# Patient Record
Sex: Female | Born: 1955 | Race: White | Hispanic: No | Marital: Married | Smoking: Never smoker
Health system: Southern US, Community
[De-identification: ages and names within clinical notes are randomized; demographics above are authoritative.]

## PROBLEM LIST (undated history)

## (undated) DIAGNOSIS — M5011 Cervical disc disorder with radiculopathy,  high cervical region: Secondary | ICD-10-CM

## (undated) DIAGNOSIS — E785 Hyperlipidemia, unspecified: Secondary | ICD-10-CM

## (undated) DIAGNOSIS — F32A Depression, unspecified: Secondary | ICD-10-CM

## (undated) DIAGNOSIS — F329 Major depressive disorder, single episode, unspecified: Secondary | ICD-10-CM

## (undated) DIAGNOSIS — M5441 Lumbago with sciatica, right side: Secondary | ICD-10-CM

## (undated) DIAGNOSIS — M5431 Sciatica, right side: Secondary | ICD-10-CM

## (undated) DIAGNOSIS — F419 Anxiety disorder, unspecified: Secondary | ICD-10-CM

## (undated) DIAGNOSIS — M1990 Unspecified osteoarthritis, unspecified site: Secondary | ICD-10-CM

## (undated) HISTORY — DX: Hyperlipidemia, unspecified: E78.5

## (undated) HISTORY — DX: Morbid (severe) obesity due to excess calories: E66.01

## (undated) HISTORY — DX: Sciatica, right side: M54.31

## (undated) HISTORY — DX: Lumbago with sciatica, right side: M54.41

## (undated) HISTORY — DX: Unspecified osteoarthritis, unspecified site: M19.90

## (undated) HISTORY — DX: Depression, unspecified: F32.A

## (undated) HISTORY — DX: Anxiety disorder, unspecified: F41.9

## (undated) HISTORY — DX: Cervical disc disorder with radiculopathy, high cervical region: M50.11

## (undated) HISTORY — PX: CHOLECYSTECTOMY: SHX55

---
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
## 2003-08-09 ENCOUNTER — Ambulatory Visit (HOSPITAL_COMMUNITY): Admission: RE | Admit: 2003-08-09 | Discharge: 2003-08-09 | Payer: Self-pay | Admitting: Obstetrics and Gynecology

## 2003-08-15 ENCOUNTER — Other Ambulatory Visit: Admission: RE | Admit: 2003-08-15 | Discharge: 2003-08-15 | Payer: Self-pay | Admitting: Obstetrics and Gynecology

## 2003-08-31 ENCOUNTER — Ambulatory Visit (HOSPITAL_COMMUNITY): Admission: RE | Admit: 2003-08-31 | Discharge: 2003-08-31 | Payer: Self-pay | Admitting: Obstetrics and Gynecology

## 2005-07-10 ENCOUNTER — Ambulatory Visit (HOSPITAL_COMMUNITY): Admission: RE | Admit: 2005-07-10 | Discharge: 2005-07-10 | Payer: Self-pay | Admitting: Obstetrics and Gynecology

## 2006-07-16 ENCOUNTER — Ambulatory Visit (HOSPITAL_COMMUNITY): Admission: RE | Admit: 2006-07-16 | Discharge: 2006-07-16 | Payer: Self-pay | Admitting: Obstetrics and Gynecology

## 2006-11-03 ENCOUNTER — Other Ambulatory Visit: Admission: RE | Admit: 2006-11-03 | Discharge: 2006-11-03 | Payer: Self-pay | Admitting: Obstetrics and Gynecology

## 2006-11-07 ENCOUNTER — Ambulatory Visit (HOSPITAL_COMMUNITY): Admission: RE | Admit: 2006-11-07 | Discharge: 2006-11-07 | Payer: Self-pay | Admitting: Obstetrics & Gynecology

## 2006-11-14 ENCOUNTER — Encounter (HOSPITAL_COMMUNITY): Admission: RE | Admit: 2006-11-14 | Discharge: 2006-12-14 | Payer: Self-pay | Admitting: Obstetrics & Gynecology

## 2006-12-08 ENCOUNTER — Ambulatory Visit (HOSPITAL_COMMUNITY): Admission: RE | Admit: 2006-12-08 | Discharge: 2006-12-08 | Payer: Self-pay | Admitting: General Surgery

## 2006-12-08 ENCOUNTER — Encounter (INDEPENDENT_AMBULATORY_CARE_PROVIDER_SITE_OTHER): Payer: Self-pay | Admitting: General Surgery

## 2007-03-05 ENCOUNTER — Ambulatory Visit (HOSPITAL_COMMUNITY): Admission: RE | Admit: 2007-03-05 | Discharge: 2007-03-05 | Payer: Self-pay | Admitting: Obstetrics & Gynecology

## 2007-08-19 ENCOUNTER — Ambulatory Visit (HOSPITAL_COMMUNITY): Admission: RE | Admit: 2007-08-19 | Discharge: 2007-08-19 | Payer: Self-pay | Admitting: Obstetrics and Gynecology

## 2008-06-23 ENCOUNTER — Ambulatory Visit (HOSPITAL_COMMUNITY): Admission: RE | Admit: 2008-06-23 | Discharge: 2008-06-23 | Payer: Self-pay | Admitting: Obstetrics & Gynecology

## 2008-08-17 ENCOUNTER — Other Ambulatory Visit: Admission: RE | Admit: 2008-08-17 | Discharge: 2008-08-17 | Payer: Self-pay | Admitting: Obstetrics and Gynecology

## 2008-08-19 ENCOUNTER — Ambulatory Visit (HOSPITAL_COMMUNITY): Admission: RE | Admit: 2008-08-19 | Discharge: 2008-08-19 | Payer: Self-pay | Admitting: Obstetrics & Gynecology

## 2008-12-05 ENCOUNTER — Ambulatory Visit (HOSPITAL_COMMUNITY): Admission: RE | Admit: 2008-12-05 | Discharge: 2008-12-05 | Payer: Self-pay | Admitting: Obstetrics and Gynecology

## 2008-12-14 ENCOUNTER — Encounter: Admission: RE | Admit: 2008-12-14 | Discharge: 2008-12-14 | Payer: Self-pay | Admitting: Obstetrics and Gynecology

## 2009-06-14 ENCOUNTER — Encounter: Admission: RE | Admit: 2009-06-14 | Discharge: 2009-06-14 | Payer: Self-pay | Admitting: Obstetrics and Gynecology

## 2010-02-25 ENCOUNTER — Encounter: Payer: Self-pay | Admitting: Obstetrics and Gynecology

## 2010-06-19 NOTE — Op Note (Signed)
NAMEMarland Kitchen  Sara Mann, Sara Mann            ACCOUNT NO.:  [id number]   MEDICAL RECORD NO.:  [id number]          PATIENT TYPE:  AMB   LOCATION:  DAY                           FACILITY:  [facility]   PHYSICIAN:  Jamesetta So, M.D.  DATE OF BIRTH:  1955/12/24   DATE OF PROCEDURE:  12/08/2006  DATE OF DISCHARGE:                               OPERATIVE REPORT   PREOPERATIVE DIAGNOSIS:  Chronic cholecystitis.   POSTOPERATIVE DIAGNOSIS:  Chronic cholecystitis.   PROCEDURE:  Laparoscopic cholecystectomy.   SURGEON:  Aviva Signs, MD   ANESTHESIA:  General endotracheal.   INDICATIONS:  The patient is a 55 year old white female who presents  with biliary colic secondary to chronic cholecystitis.  The risks and  benefits of the procedure including bleeding, infection, hepatobiliary  injury, and the possibly of an open procedure were fully explained to  the patient, who gave informed consent.   PROCEDURE NOTE:  The patient was placed in the supine position.  After  the induction of general endotracheal anesthesia, the abdomen was  prepped and draped using the usual sterile technique with Betadine.  Surgical site confirmation was performed.   A supraumbilical incision was made down to fascia.  A Veress needle was  introduced into the abdominal cavity and confirmation of placement was  done using the saline drop test.  The abdomen was then insufflated to 16-  mmHg pressure.  An 11-mm trocar was introduced into the abdominal cavity  under direct visualization without difficulty.  The patient was placed  in reversed Trendelenburg position.  An additional 11-mm trocar was  placed in the epigastric region and 5-mm trocars were placed in the  right upper quadrant and right flank regions.  The liver was inspected  and noted to within normal limits.  The gallbladder was retracted  superior and laterally.  The dissection was begun around the  infundibulum of the gallbladder.  The cystic duct was first  identified.  It juncture to the infundibulum was fully identified.  Endoclips were  placed proximally and distally on the cystic duct and cystic duct was  divided; this likewise done to the cystic artery.  The gallbladder was  then freed away from the gallbladder fossa using Bovie electrocautery.  The gallbladder was delivered through the epigastric trocar site using  an EndoCatch bag.  The gallbladder fossa was inspected and no abnormal  bleeding or bile leakage was noted.  Surgicel was placed in the  gallbladder fossa.  All fluid and air were then evacuated from the  abdominal cavity prior to the removal of the trocars.   All wounds were irrigated with normal saline.  All wounds were injected  with 0.5% Sensorcaine.  The supraumbilical fascia was reapproximated  using an 0 Vicryl interrupted suture.  All skin incisions were closed  using staples.  Betadine ointment and dry sterile dressings were  applied.   All tape and needle counts were correct at the end of the procedure.  The patient was extubated in the operating room and went back to the  recovery room, awake and in stable condition.   COMPLICATIONS:  None.  SPECIMEN:  Gallbladder.   BLOOD LOSS:  Minimal.      Jamesetta So, M.D.  Electronically Signed     MAJ/MEDQ  D:  12/08/2006  T:  12/09/2006  Job:  [id number]   cc:   Florian Buff, M.D.  Fax: [phone]   Monico Blitz, MD  Fax: [phone]

## 2010-06-19 NOTE — H&P (Signed)
NAMEMarland Kitchen  Sara Mann, Sara Mann            ACCOUNT NO.:  [id number]   MEDICAL RECORD NO.:  [id number]          PATIENT TYPE:  AMB   LOCATION:  DAY                           FACILITY:  [facility]   PHYSICIAN:  Jamesetta So, M.D.  DATE OF BIRTH:  06-15-1955   DATE OF ADMISSION:  12/01/2006  DATE OF DISCHARGE:  LH                              HISTORY & PHYSICAL   CHIEF COMPLAINT:  Chronic cholecystitis.   HISTORY OF PRESENT ILLNESS:  The patient is a 55 year old white female  who is referred for evaluation treatment of biliary colic secondary to  chronic cholecystitis.  She has been having intermittent right upper  quadrant abdominal pain with radiation to the right flank, nausea, and  bloating for many months.  No fever, chills, or jaundice have been  noted.  She does have fatty food intolerance.   PAST MEDICAL HISTORY:  1. Anxiety.  2. Extrinsic allergies.   PAST SURGICAL HISTORY:  Unremarkable.   CURRENT MEDICATIONS:  Aleve.   ALLERGIES:  CODEINE, SOME ANTIHISTAMINES.   REVIEW OF SYSTEMS:  The patient denies drinking or smoking.  She denies  any other cardiopulmonary difficulties or bleeding disorders.   PHYSICAL EXAMINATION:  The patient is a well-developed, well-nourished  white female in no acute distress.  HEENT: No scleral icterus.  LUNGS:  Clear to auscultation with equal breath sounds bilaterally.  HEART:  A regular rate and rhythm without S3, S4 or murmurs.  ABDOMEN:  Soft and nondistended.  She is tender in the right upper  quadrant to palpation.  No hepatosplenomegaly, masses, or hernias are  identified.   HIDA scan reveals chronic cholecystitis with a low gallbladder ejection  fraction and reproducible symptoms.   IMPRESSION:  Chronic cholecystitis.   PLAN:  The patient is scheduled for a laparoscopic cholecystectomy on  December 01, 2006.  The risks and benefits of the procedure including  bleeding, infection, hepatobiliary injury, and the possibly an open  procedure,  were fully explained to the patient, who gave informed  consent.      Jamesetta So, M.D.  Electronically Signed     MAJ/MEDQ  D:  11/27/2006  T:  11/28/2006  Job:  [id number]   cc:   [facility]  Fax: [phone]   Florian Buff, M.D.  Fax: [phone]   Monico Blitz, MD  Fax: [phone]

## 2010-11-14 LAB — BASIC METABOLIC PANEL
BUN: 9
CO2: 26
Calcium: 9.5
Chloride: 108
Creatinine, Ser: 0.57
GFR calc Af Amer: >60
GFR calc non Af Amer: >60
Glucose, Bld: 85
Potassium: 4.3
Sodium: 140

## 2010-11-14 LAB — HEPATIC FUNCTION PANEL
ALT: 21
AST: 21
Alkaline Phosphatase: 78
Bilirubin, Direct: 0.1
Indirect Bilirubin: 0.7
Total Bilirubin: 0.8

## 2010-11-14 LAB — CBC
MCHC: 33.9
MCV: 85.1
RDW: 14.2 — ABNORMAL HIGH

## 2019-01-05 ENCOUNTER — Ambulatory Visit: Payer: Medicare Other

## 2019-01-05 ENCOUNTER — Other Ambulatory Visit: Payer: Self-pay

## 2019-01-05 ENCOUNTER — Encounter: Payer: Self-pay | Admitting: Orthopaedic Surgery

## 2019-01-05 ENCOUNTER — Ambulatory Visit: Payer: Medicare Other | Admitting: Orthopaedic Surgery

## 2019-01-05 VITALS — BP 175/85 | HR 74 | Temp 96.4°F | Ht 61.5 in | Wt 230.2 lb

## 2019-01-05 DIAGNOSIS — G8929 Other chronic pain: Secondary | ICD-10-CM

## 2019-01-05 DIAGNOSIS — M25511 Pain in right shoulder: Secondary | ICD-10-CM

## 2019-01-05 DIAGNOSIS — Z6841 Body Mass Index (BMI) 40.0 and over, adult: Secondary | ICD-10-CM

## 2019-01-05 NOTE — Progress Notes (Signed)
Subjective:    Patient ID: Sara Mann, female    DOB: [birth year], 63 y.o.   MRN: [id number]  HPI She has had pain of the right shoulder for several months getting slowly worse.  She has no trauma, no redness,no swelling.  She has had some radiation to the elbow on the right. She has no neck pain.  It hurts more with elevation of hand above head or rolling on it at night in bed.  She has no other joint pain.  She was seen recently by family doctor and put on Mobic which has helped.  She has used ice and heat as well.  I have reviewed the notes from her primary care.   Review of Systems  Constitutional: Positive for activity change.  Musculoskeletal: Positive for arthralgias and myalgias.  All other systems reviewed and are negative.  For Review of Systems, all other systems reviewed and are negative.  The following is a summary of the past history medically, past history surgically, known current medicines, social history and family history.  This information is gathered electronically by the computer from prior information and documentation.  I review this each visit and have found including this information at this point in the chart is beneficial and informative.   No past medical history on file.    Current Outpatient Medications on File Prior to Visit  Medication Sig Dispense Refill  . meloxicam (MOBIC) 15 MG tablet Take 15 mg by mouth daily.     No current facility-administered medications on file prior to visit.     Social History   Socioeconomic History  . Marital status: Married    Spouse name: Not on file  . Number of children: Not on file  . Years of education: Not on file  . Highest education level: Not on file  Occupational History  . Not on file  Social Needs  . Financial resource strain: Not on file  . Food insecurity    Worry: Not on file    Inability: Not on file  . Transportation needs    Medical: Not on file    Non-medical: Not on file   Tobacco Use  . Smoking status: Never Smoker  . Smokeless tobacco: Never Used  Substance and Sexual Activity  . Alcohol use: Not on file  . Drug use: Not on file  . Sexual activity: Not on file  Lifestyle  . Physical activity    Days per week: Not on file    Minutes per session: Not on file  . Stress: Not on file  Relationships  . Social Musician on phone: Not on file    Gets together: Not on file    Attends religious service: Not on file    Active member of club or organization: Not on file    Attends meetings of clubs or organizations: Not on file    Relationship status: Not on file  . Intimate partner violence    Fear of current or ex partner: Not on file    Emotionally abused: Not on file    Physically abused: Not on file    Forced sexual activity: Not on file  Other Topics Concern  . Not on file  Social History Narrative  . Not on file   Family history of heart disease.  BP (!) 175/85   Pulse 74   Temp (!) 96.4 F (35.8 C)   Ht 5' 1.5" (1.562 m)   Wt 230  lb 4 oz (104.4 kg)   BMI 42.80 kg/m   Body mass index is 42.8 kg/m.  The patient meets the AMA guidelines for Morbid (severe) obesity with a BMI > 40.0 and I have recommended weight loss.      Objective:   Physical Exam Vitals signs reviewed.  Constitutional:      Appearance: She is well-developed.  HENT:     Head: Normocephalic and atraumatic.  Eyes:     Conjunctiva/sclera: Conjunctivae normal.     Pupils: Pupils are equal, round, and reactive to light.  Neck:     Musculoskeletal: Normal range of motion and neck supple.  Cardiovascular:     Rate and Rhythm: Normal rate and regular rhythm.  Pulmonary:     Effort: Pulmonary effort is normal.  Abdominal:     Palpations: Abdomen is soft.  Musculoskeletal:     Right shoulder: She exhibits decreased range of motion and tenderness.       Arms:  Skin:    General: Skin is warm and dry.  Neurological:     Mental Status: She is alert and  oriented to person, place, and time.     Cranial Nerves: No cranial nerve deficit.     Motor: No abnormal muscle tone.     Coordination: Coordination normal.     Deep Tendon Reflexes: Reflexes are normal and symmetric. Reflexes normal.  Psychiatric:        Behavior: Behavior normal.        Thought Content: Thought content normal.        Judgment: Judgment normal.   X-rays were ordered of the left shoulder, reported separately.        Assessment & Plan:   Encounter Diagnoses  Name Primary?  . Chronic right shoulder pain Yes  . Body mass index 40.0-44.9, adult ([place])   . Morbid obesity ([place])    PROCEDURE NOTE:  The patient request injection, verbal consent was obtained.  The right shoulder was prepped appropriately after time out was performed.   Sterile technique was observed and injection of 1 cc of Depo-Medrol 40 mg with several cc's of plain xylocaine. Anesthesia was provided by ethyl chloride and a 20-gauge needle was used to inject the shoulder area. A posterior approach was used.  The injection was tolerated well.  A band aid dressing was applied.  The patient was advised to apply ice later today and tomorrow to the injection sight as needed.  Continue the Mobic.  Use Aspercreme or BioFreeze to the area as desired.  Return in three weeks.  Call if any problem.  Precautions discussed.   Electronically Signed Sanjuana Kava, MD 12/1/20202:49 PM

## 2019-01-13 ENCOUNTER — Other Ambulatory Visit: Payer: Self-pay | Admitting: Internal Medicine

## 2019-01-13 DIAGNOSIS — E2839 Other primary ovarian failure: Secondary | ICD-10-CM

## 2019-01-26 ENCOUNTER — Ambulatory Visit: Payer: Medicare Other | Admitting: Orthopaedic Surgery

## 2019-01-26 ENCOUNTER — Encounter: Payer: Self-pay | Admitting: Orthopaedic Surgery

## 2019-01-26 ENCOUNTER — Other Ambulatory Visit: Payer: Self-pay

## 2019-01-26 VITALS — BP 153/72 | HR 72 | Ht 61.5 in | Wt 230.0 lb

## 2019-01-26 DIAGNOSIS — G8929 Other chronic pain: Secondary | ICD-10-CM | POA: Diagnosis not present

## 2019-01-26 DIAGNOSIS — M25511 Pain in right shoulder: Secondary | ICD-10-CM

## 2019-01-26 DIAGNOSIS — Z6841 Body Mass Index (BMI) 40.0 and over, adult: Secondary | ICD-10-CM

## 2019-01-26 NOTE — Progress Notes (Signed)
CC  I am much better  She has little pain now of the right shoulder.  She has full motion.  NV intact.  Encounter Diagnoses  Name Primary?  . Chronic right shoulder pain Yes  . Body mass index 40.0-44.9, adult ([place])   . Morbid obesity ([place])    I will see as needed.  Discharge.  Call if any problem.  Precautions discussed.   Electronically Signed Sanjuana Kava, MD 12/22/20202:18 PM

## 2019-02-02 ENCOUNTER — Other Ambulatory Visit: Payer: Self-pay | Admitting: Internal Medicine

## 2019-02-02 ENCOUNTER — Encounter: Payer: Self-pay | Admitting: Gastroenterology

## 2019-02-02 DIAGNOSIS — R921 Mammographic calcification found on diagnostic imaging of breast: Secondary | ICD-10-CM

## 2019-02-10 ENCOUNTER — Other Ambulatory Visit: Payer: Self-pay

## 2019-02-10 ENCOUNTER — Ambulatory Visit: Payer: Medicare Other | Attending: Internal Medicine

## 2019-02-10 DIAGNOSIS — Z20822 Contact with and (suspected) exposure to covid-19: Secondary | ICD-10-CM

## 2019-02-12 LAB — NOVEL CORONAVIRUS, NAA: SARS-CoV-2, NAA: NOT DETECTED

## 2019-03-08 ENCOUNTER — Encounter: Payer: Self-pay | Admitting: Emergency Medicine

## 2019-03-09 ENCOUNTER — Encounter: Payer: Self-pay | Admitting: Gastroenterology

## 2019-03-09 ENCOUNTER — Other Ambulatory Visit: Payer: Self-pay

## 2019-03-09 ENCOUNTER — Ambulatory Visit: Payer: Medicare Other | Admitting: Gastroenterology

## 2019-03-09 VITALS — BP 160/86 | HR 70 | Temp 98.3°F | Ht 61.5 in | Wt 233.0 lb

## 2019-03-09 DIAGNOSIS — Z01818 Encounter for other preprocedural examination: Secondary | ICD-10-CM

## 2019-03-09 DIAGNOSIS — R195 Other fecal abnormalities: Secondary | ICD-10-CM

## 2019-03-09 DIAGNOSIS — K219 Gastro-esophageal reflux disease without esophagitis: Secondary | ICD-10-CM

## 2019-03-09 MED ORDER — NA SULFATE-K SULFATE-MG SULF 17.5-3.13-1.6 GM/177ML PO SOLN: 1.0000 | ORAL | 0 refills | Status: AC

## 2019-03-09 NOTE — Progress Notes (Signed)
Referring Provider: Rocco Serene, MD Primary Care Physician:  Rocco Serene, MD  Reason for Consultation:  Fit+   IMPRESSION:  Fit+ BMI 43.31 Daily meloxican x 8 weeks GERD History of hemorrhoids diagnosed by her OB/GYN Fear of colonoscopy  FIT positive without associated symptoms or known anemia.  Will obtain results from her recent blood work for confirmation.  Colonoscopy recommended to evaluate the Fit+ stool test.  Reviewed reflux.  Lifestyle modifications reviewed.  Patient does not feel her symptoms are frequent enough to warrant treatment.  Symptoms may be exacerbated by meloxicam.    PLAN: Obtain labs from Dr. Arline Asp documenting recent CBC Colonoscopy GERD lifestyle modifications Avoid NSAIDs as able  The nature of the procedure, as well as the risks, benefits, and alternatives were carefully and thoroughly reviewed with the patient. Ample time for discussion and questions allowed. The patient understood, was satisfied, and agreed to proceed.  Please see the "Patient Instructions" section for addition details about the plan.  HPI: Sara Mann is a 64 y.o. female referred by Dr. Arline Asp for further evaluation of a positive fit.  The history is obtained through the patient and review of her electronic health records as well as her referral records from Dr. Arline Asp.  She has arthritis, hyperlipidemia, hypertension, anxiety, depression, and obesity with a BMI of 43.  She has previously had a cholecystectomy and has experienced some post-prandial diarrhea since that time. She is a self-employed Programmer, applications.   Labs from 01/17/2019 show a positive fecal fit test  No symptoms associated with the +FIT.  No overt GI blood loss. No melena, hematochezia, bright red blood per rectum. No epistaxis, vaginal bleeding, hemoptysis, or hematuria.  Dark stools when she takes iron. She often feels tired so she will use an iron pill when needed. She has no official diagnosis of iron  deficiency.   Has hemorrhoids with recent burning and itching. Diagnosed by OB/GYN.    On meloxicam for chronic right shoulder pain over the last 8-9 weeks.  History of reflux, worse with weight gain that occurred after retired. Tries to avoid eating at night.   She recently had a CBC with Dr. Arline Asp.   No prior colonoscopy or endoscopy. She is afraid of having a colonoscopy.   Mother with ovarian cancer.  No known family history of colon cancer or polyps. No family history of uterine/endometrial cancer, pancreatic cancer or gastric/stomach cancer.   Past Medical History:  Diagnosis Date  . Anxiety   . Arthritis   . Depression   . 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 Hyperlipidemia   . Low back pain with right-sided sciatica   . Morbidly obese ([place])   . Right sided sciatica     Past Surgical History:  Procedure Laterality Date  . CHOLECYSTECTOMY      Current Outpatient Medications  Medication Sig Dispense Refill  . hydrochlorothiazide (HYDRODIURIL) 12.5 MG tablet Take 12.5 mg by mouth daily.    . meloxicam (MOBIC) 15 MG tablet Take 15 mg by mouth daily.     No current facility-administered medications for this visit.    Allergies as of 03/09/2019 - Review Complete 01/26/2019  Allergen Reaction Noted  . Benadryl [diphenhydramine]  01/05/2019  . Hydrocodone-acetaminophen  01/05/2019    Family History  Problem Relation Age of Onset  . Ovarian cancer Mother   . Hypertension Daughter     Social History   Socioeconomic History  .  Marital status: Married    Spouse name: Not on file  . Number of children: Not on file  . Years of education: Not on file  . Highest education level: Not on file  Occupational History  . Not on file  Tobacco Use  . Smoking status: Never Smoker  . Smokeless tobacco: Never Used  Substance and Sexual Activity  . Alcohol use: Not on file  . Drug use: Not on file  . Sexual activity: Not on file    Other Topics Concern  . Not on file  Social History Narrative  . Not on file   Social Determinants of Health   Financial Resource Strain:   . Difficulty of Paying Living Expenses: Not on file  Food Insecurity:   . Worried About Charity fundraiser in the Last Year: Not on file  . Ran Out of Food in the Last Year: Not on file  Transportation Needs:   . Lack of Transportation (Medical): Not on file  . Lack of Transportation (Non-Medical): Not on file  Physical Activity:   . Days of Exercise per Week: Not on file  . Minutes of Exercise per Session: Not on file  Stress:   . Feeling of Stress : Not on file  Social Connections:   . Frequency of Communication with Friends and Family: Not on file  . Frequency of Social Gatherings with Friends and Family: Not on file  . Attends Religious Services: Not on file  . Active Member of Clubs or Organizations: Not on file  . Attends Archivist Meetings: Not on file  . Marital Status: Not on file  Intimate Partner Violence:   . Fear of Current or Ex-Partner: Not on file  . Emotionally Abused: Not on file  . Physically Abused: Not on file  . Sexually Abused: Not on file    Review of Systems: 12 system ROS is negative except as noted above.   Physical Exam: General:   Alert,  well-nourished, pleasant and cooperative in NAD Head:  Normocephalic and atraumatic. Eyes:  Sclera clear, no icterus.   Conjunctiva pink. Ears:  Normal auditory acuity. Nose:  No deformity, discharge,  or lesions. Mouth:  No deformity or lesions.   Neck:  Supple; no masses or thyromegaly. Lungs:  Clear throughout to auscultation.   No wheezes. Heart:  Regular rate and rhythm; no murmurs. Abdomen:  Soft,nontender, nondistended, normal bowel sounds, no rebound or guarding. No hepatosplenomegaly.   Rectal:  Deferred  Msk:  Symmetrical. No boney deformities LAD: No inguinal or umbilical LAD Extremities:  No clubbing or edema. Neurologic:  Alert and   oriented x4;  grossly nonfocal Skin:  Intact without significant lesions or rashes. Psych:  Alert and cooperative. Normal mood and affect.    Greenleigh Kauth L. Tarri Glenn, MD, MPH 03/09/2019, 12:48 PM

## 2019-03-09 NOTE — Patient Instructions (Signed)
You have been scheduled for a colonoscopy. Please follow written instructions given to you at your visit today.  Please pick up your prep supplies at the pharmacy within the next 1-3 days. If you use inhalers (even only as needed), please bring them with you on the day of your procedure.  Patient advised to avoid spicy, acidic, citrus, chocolate, mints, fruit and fruit juices.  Limit the intake of caffeine, alcohol and Soda.  Don't exercise too soon after eating.  Don't lie down within 3-4 hours of eating.  Elevate the head of your bed.

## 2019-04-09 ENCOUNTER — Encounter: Payer: Self-pay | Admitting: Gastroenterology

## 2019-04-20 ENCOUNTER — Ambulatory Visit (INDEPENDENT_AMBULATORY_CARE_PROVIDER_SITE_OTHER): Payer: Medicare Other

## 2019-04-20 ENCOUNTER — Other Ambulatory Visit: Payer: Self-pay | Admitting: Gastroenterology

## 2019-04-20 DIAGNOSIS — Z1159 Encounter for screening for other viral diseases: Secondary | ICD-10-CM

## 2019-04-20 LAB — SARS CORONAVIRUS 2 (TAT 6-24 HRS): SARS Coronavirus 2: NEGATIVE

## 2019-04-22 ENCOUNTER — Encounter: Payer: Self-pay | Admitting: Gastroenterology

## 2019-04-22 ENCOUNTER — Other Ambulatory Visit: Payer: Self-pay

## 2019-04-22 ENCOUNTER — Ambulatory Visit (AMBULATORY_SURGERY_CENTER): Payer: Medicare Other | Admitting: Gastroenterology

## 2019-04-22 VITALS — BP 118/58 | HR 71 | Temp 96.2°F | Resp 20 | Ht 65.0 in | Wt 233.0 lb

## 2019-04-22 DIAGNOSIS — R195 Other fecal abnormalities: Secondary | ICD-10-CM | POA: Diagnosis not present

## 2019-04-22 DIAGNOSIS — K648 Other hemorrhoids: Secondary | ICD-10-CM | POA: Diagnosis not present

## 2019-04-22 DIAGNOSIS — K5180 Other ulcerative colitis without complications: Secondary | ICD-10-CM

## 2019-04-22 DIAGNOSIS — K5150 Left sided colitis without complications: Secondary | ICD-10-CM | POA: Diagnosis not present

## 2019-04-22 DIAGNOSIS — K529 Noninfective gastroenteritis and colitis, unspecified: Secondary | ICD-10-CM

## 2019-04-22 MED ORDER — SODIUM CHLORIDE 0.9 % IV SOLN: 500.0000 mL | Freq: Once | INTRAVENOUS | Status: AC

## 2019-04-22 NOTE — Progress Notes (Signed)
Called to room to assist during endoscopic procedure.  Patient ID and intended procedure confirmed with present staff. Received instructions for my participation in the procedure from the performing physician.  

## 2019-04-22 NOTE — Progress Notes (Signed)
Cw vitals, HC IV and LC temp.

## 2019-04-22 NOTE — Patient Instructions (Signed)
NO ASPIRIN, ASPIRIN CONTAINING PRODUCTS (BC OR GOODY POWDERS) OR NSAIDS (IBUPROFEN, ADVIL, ALEVE, AND MOTRIN)  Or Meloxicam ; TYLENOL IS OK TO TAKE   Await biopsy results from Dr Tarri Glenn.  Handout on hemorrhoids given to you today    YOU HAD AN ENDOSCOPIC PROCEDURE TODAY AT [facility]:   Refer to the procedure report that was given to you for any specific questions about what was found during the examination.  If the procedure report does not answer your questions, please call your gastroenterologist to clarify.  If you requested that your care partner not be given the details of your procedure findings, then the procedure report has been included in a sealed envelope for you to review at your convenience later.  YOU SHOULD EXPECT: Some feelings of bloating in the abdomen. Passage of more gas than usual.  Walking can help get rid of the air that was put into your GI tract during the procedure and reduce the bloating. If you had a lower endoscopy (such as a colonoscopy or flexible sigmoidoscopy) you may notice spotting of blood in your stool or on the toilet paper. If you underwent a bowel prep for your procedure, you may not have a normal bowel movement for a few days.  Please Note:  You might notice some irritation and congestion in your nose or some drainage.  This is from the oxygen used during your procedure.  There is no need for concern and it should clear up in a day or so.  SYMPTOMS TO REPORT IMMEDIATELY:   Following lower endoscopy (colonoscopy or flexible sigmoidoscopy):  Excessive amounts of blood in the stool  Significant tenderness or worsening of abdominal pains  Swelling of the abdomen that is new, acute  Fever of 100F or higher    For urgent or emergent issues, a gastroenterologist can be reached at any hour by calling [phone]. Do not use MyChart messaging for urgent concerns.    DIET:  We do recommend a small meal at first, but then you may  proceed to your regular diet.  Drink plenty of fluids but you should avoid alcoholic beverages for 24 hours.  ACTIVITY:  You should plan to take it easy for the rest of today and you should NOT DRIVE or use heavy machinery until tomorrow (because of the sedation medicines used during the test).    FOLLOW UP: Our staff will call the number listed on your records 48-72 hours following your procedure to check on you and address any questions or concerns that you may have regarding the information given to you following your procedure. If we do not reach you, we will leave a message.  We will attempt to reach you two times.  During this call, we will ask if you have developed any symptoms of COVID 19. If you develop any symptoms (ie: fever, flu-like symptoms, shortness of breath, cough etc.) before then, please call [phone].  If you test positive for Covid 19 in the 2 weeks post procedure, please call and report this information to [place].    If any biopsies were taken you will be contacted by phone or by letter within the next 1-3 weeks.  Please call us at [phone] if you have not heard about the biopsies in 3 weeks.    SIGNATURES/CONFIDENTIALITY: You and/or your care partner have signed paperwork which will be entered into your electronic medical record.  These signatures attest to the fact that that the information  above on your After Visit Summary has been reviewed and is understood.  Full responsibility of the confidentiality of this discharge information lies with you and/or your care-partner.

## 2019-04-22 NOTE — Progress Notes (Signed)
Report given to PACU, vss 

## 2019-04-22 NOTE — Op Note (Signed)
[facility] Patient Name: Sara Mann Procedure Date: 04/22/2019 1:58 PM MRN: [id number] Endoscopist: Thornton Park MD, MD Age: 64 Referring MD:  Date of Birth: 1956-01-15 Gender: Female Account #: [id number] Procedure:                Colonoscopy Indications:              Positive fecal immunochemical test                           Fit+                           Daily meloxican x 8 weeks                           History of hemorrhoids diagnosed by her OB/GYN                           No known family history of colon cancer or polyps Medicines:                Monitored Anesthesia Care Procedure:                Pre-Anesthesia Assessment:                           - Prior to the procedure, a History and Physical                            was performed, and patient medications and                            allergies were reviewed. The patient's tolerance of                            previous anesthesia was also reviewed. The risks                            and benefits of the procedure and the sedation                            options and risks were discussed with the patient.                            All questions were answered, and informed consent                            was obtained. Prior Anticoagulants: The patient has                            taken no previous anticoagulant or antiplatelet                            agents. ASA Grade Assessment: II - A patient with  mild systemic disease. After reviewing the risks                            and benefits, the patient was deemed in                            satisfactory condition to undergo the procedure.                           After obtaining informed consent, the colonoscope                            was passed under direct vision. Throughout the                            procedure, the patient's blood pressure, pulse, and                            oxygen saturations  were monitored continuously. The                            Colonoscope was introduced through the anus and                            advanced to the the cecum, identified by                            appendiceal orifice and ileocecal valve. The                            colonoscopy was technically difficult and complex                            due to a redundant colon, significant looping and a                            tortuous colon. Successful completion of the                            procedure was aided by changing the patient's                            position and applying abdominal pressure. The                            patient tolerated the procedure well. The quality                            of the bowel preparation was good. The ileocecal                            valve, appendiceal orifice, and rectum were  photographed. Scope In: 2:06:56 PM Scope Out: 2:27:54 PM Scope Withdrawal Time: 0 hours 12 minutes 54 seconds  Total Procedure Duration: 0 hours 20 minutes 58 seconds  Findings:                 Hemorrhoids were found on perianal exam.                           A patchy area of mildly altered vascular,                            erythematous and ulcerated mucosa was found in the                            distal sigmoid colon and in the proximal right                            colon. Biopsies were taken with a cold forceps for                            histology, as well as from the right colon,                            transverse colon, left colon, and rectum. Estimated                            blood loss was minimal.                           Non-bleeding internal hemorrhoids were found. The                            hemorrhoids were small.                           The exam was otherwise without abnormality on                            direct and retroflexion views. Complications:            No immediate complications.  Estimated blood loss:                            Minimal. Estimated Blood Loss:     Estimated blood loss was minimal. Impression:               - Hemorrhoids found on perianal exam.                           - Altered vascular, erythematous and ulcerated                            mucosa in the distal sigmoid colon and ascending                            colon. Clinical significance is unclear. Biopsied.                           -  Non-bleeding internal hemorrhoids.                           - The examination was otherwise normal on direct                            and retroflexion views. Recommendation:           - Patient has a contact number available for                            emergencies. The signs and symptoms of potential                            delayed complications were discussed with the                            patient. Return to normal activities tomorrow.                            Written discharge instructions were provided to the                            patient.                           - Resume previous diet.                           - Continue present medications.                           - No aspirin, ibuprofen, naproxen, or other                            non-steroidal anti-inflammatory drugs as                            medications like meloxican may cause these changes.                           - Await pathology results.                           - Repeat colonoscopy in 10 years for screening                            purposes, earlier with new symptoms.                           - Emerging evidence supports eating a diet of                            fruits, vegetables, grains, calcium, and yogurt                            while reducing red meat  and alcohol may reduce the                            risk of colon cancer.                           - Thank you for allowing me to be involved in your                            colon cancer  prevention. Thornton Park MD, MD 04/22/2019 2:35:53 PM This report has been signed electronically.

## 2019-04-26 ENCOUNTER — Telehealth: Payer: Self-pay

## 2019-04-26 NOTE — Telephone Encounter (Signed)
First post procedure follow up call, no answer 

## 2019-04-26 NOTE — Telephone Encounter (Signed)
Second post procedure follow up call, no answer

## 2019-04-27 ENCOUNTER — Ambulatory Visit
Admission: RE | Admit: 2019-04-27 | Discharge: 2019-04-27 | Disposition: A | Discharge status: Home / Self Care | Payer: Medicare Other | Source: Ambulatory Visit | Attending: Internal Medicine | Admitting: Internal Medicine

## 2019-04-27 ENCOUNTER — Other Ambulatory Visit: Payer: Self-pay

## 2019-04-27 ENCOUNTER — Other Ambulatory Visit: Payer: Self-pay | Admitting: Internal Medicine

## 2019-04-27 DIAGNOSIS — N6320 Unspecified lump in the left breast, unspecified quadrant: Secondary | ICD-10-CM

## 2019-04-27 DIAGNOSIS — E2839 Other primary ovarian failure: Secondary | ICD-10-CM

## 2019-04-27 DIAGNOSIS — R921 Mammographic calcification found on diagnostic imaging of breast: Secondary | ICD-10-CM

## 2019-04-28 ENCOUNTER — Encounter: Payer: Self-pay | Admitting: *Deleted

## 2019-05-27 ENCOUNTER — Encounter: Payer: Self-pay | Admitting: Physician Assistant

## 2019-05-27 ENCOUNTER — Ambulatory Visit: Payer: Medicare Other | Admitting: Physician Assistant

## 2019-05-27 ENCOUNTER — Other Ambulatory Visit (INDEPENDENT_AMBULATORY_CARE_PROVIDER_SITE_OTHER): Payer: Medicare Other

## 2019-05-27 VITALS — BP 116/66 | HR 76 | Temp 97.9°F | Ht 65.0 in | Wt 233.2 lb

## 2019-05-27 DIAGNOSIS — K51919 Ulcerative colitis, unspecified with unspecified complications: Secondary | ICD-10-CM | POA: Diagnosis not present

## 2019-05-27 DIAGNOSIS — R152 Fecal urgency: Secondary | ICD-10-CM

## 2019-05-27 LAB — COMPREHENSIVE METABOLIC PANEL
ALT: 25 U/L (ref 0–35)
AST: 18 U/L (ref 0–37)
Albumin: 4.1 g/dL (ref 3.5–5.2)
Alkaline Phosphatase: 71 U/L (ref 39–117)
BUN: 10 mg/dL (ref 6–23)
CO2: 32 mEq/L (ref 19–32)
Calcium: 9.1 mg/dL (ref 8.4–10.5)
Chloride: 101 mEq/L (ref 96–112)
Creatinine, Ser: 0.75 mg/dL (ref 0.40–1.20)
GFR: 77.41 mL/min (ref >60.00)
Glucose, Bld: 124 mg/dL — ABNORMAL HIGH (ref 70–99)
Potassium: 3.5 mEq/L (ref 3.5–5.1)
Sodium: 141 mEq/L (ref 135–145)
Total Bilirubin: 0.8 mg/dL (ref 0.2–1.2)
Total Protein: 7.2 g/dL (ref 6.0–8.3)

## 2019-05-27 LAB — CBC WITH DIFFERENTIAL/PLATELET
Basophils Absolute: 0.1 10*3/uL (ref 0.0–0.1)
Basophils Relative: 1.1 % (ref 0.0–3.0)
Eosinophils Absolute: 0.2 10*3/uL (ref 0.0–0.7)
Eosinophils Relative: 2.6 % (ref 0.0–5.0)
HCT: 41.3 % (ref 36.0–46.0)
Hemoglobin: 13.9 g/dL (ref 12.0–15.0)
Lymphocytes Relative: 34.4 % (ref 12.0–46.0)
Lymphs Abs: 3 10*3/uL (ref 0.7–4.0)
MCHC: 33.6 g/dL (ref 30.0–36.0)
MCV: 89.6 fl (ref 78.0–100.0)
Monocytes Absolute: 0.5 10*3/uL (ref 0.1–1.0)
Monocytes Relative: 5.6 % (ref 3.0–12.0)
Neutro Abs: 4.9 10*3/uL (ref 1.4–7.7)
Neutrophils Relative %: 56.3 % (ref 43.0–77.0)
Platelets: 310 10*3/uL (ref 150.0–400.0)
RBC: 4.61 Mil/uL (ref 3.87–5.11)
RDW: 14.3 % (ref 11.5–15.5)
WBC: 8.6 10*3/uL (ref 4.0–10.5)

## 2019-05-27 LAB — HIGH SENSITIVITY CRP: CRP, High Sensitivity: 16.66 mg/L — ABNORMAL HIGH (ref 0.000–5.000)

## 2019-05-27 LAB — SEDIMENTATION RATE: Sed Rate: 38 mm/hr — ABNORMAL HIGH (ref 0–30)

## 2019-05-27 MED ORDER — HYOSCYAMINE SULFATE 0.125 MG SL SUBL: SUBLINGUAL_TABLET | SUBLINGUAL | 2 refills | Status: DC

## 2019-05-27 MED ORDER — MESALAMINE 1.2 G PO TBEC: 1.2000 g | DELAYED_RELEASE_TABLET | Freq: Two times a day (BID) | ORAL | 11 refills | Status: DC

## 2019-05-27 NOTE — Progress Notes (Signed)
Subjective:    Patient ID: Sara Mann, female    DOB: Oct 20, 1955, 64 y.o.   MRN: [id number]  HPI Sara Mann is a pleasant 64 year old white female, established with Dr. Tarri Glenn who comes in today to discuss recent colonoscopy path results.  Patient was last seen in February 2021 with a positive FIT test. Colonoscopy was done on 04/22/2019 with finding of external hemorrhoids.  There was altered vascularity and erythematous ulcerated mucosa in the distal sigmoid colon and in the ascending colon.  Biopsies were taken.  Biopsies from the right colon showed chronic mildly active colitis, biopsies from transverse colon no active inflammation, random left-sided biopsies showed quiescent colitis and biopsy from the distal sigmoid shows chronic moderately active colitis, rectal biopsy showed no active colitis.  Patient says she has had some IBS type symptoms over the past several years particularly notable when she goes out to eat which is frequently she will have urgency and diarrhea postprandially.  She does have occasional abdominal cramping which is sporadic.  She has normal bowel movements in between these episodes of urgency.  She has had some occasional mild nausea.  Her other complaint is chronic fatigue which she has had over the past couple of years.  She says she can sleep 10 hours a night and still wake up feeling exhausted. She has not been having any going diarrhea or loose stools and no hematochezia. She says in fact she has been constipated since the colonoscopy.  She did not go for several days after the colonoscopy and then since then has only had a couple of bowel movements going several days in between.  She has not taken any laxatives but says she does feel bloated and uncomfortable.  Review of Systems Pertinent positive and negative review of systems were noted in the above HPI section.  All other review of systems was otherwise negative.  Outpatient Encounter Medications as of  05/27/2019  Medication Sig  . hydrochlorothiazide (HYDRODIURIL) 12.5 MG tablet Take 12.5 mg by mouth daily.  Marland Kitchen levocetirizine (XYZAL) 5 MG tablet Take 5 mg by mouth every evening.  . venlafaxine XR (EFFEXOR-XR) 75 MG 24 hr capsule Take 75 mg by mouth daily.  . hyoscyamine (LEVSIN SL) 0.125 MG SL tablet Take 1/2 hour before meal as needed for urgency/diarrhea  . mesalamine (LIALDA) 1.2 g EC tablet Take 1 tablet (1.2 g total) by mouth in the morning and at bedtime.  . [DISCONTINUED] meloxicam (MOBIC) 15 MG tablet Take 15 mg by mouth daily.  . [DISCONTINUED] venlafaxine XR (EFFEXOR-XR) 37.5 MG 24 hr capsule Take 37.5 mg by mouth daily.   Facility-Administered Encounter Medications as of 05/27/2019  Medication  . 0.9 %  sodium chloride infusion   Allergies  Allergen Reactions  . Benadryl [Diphenhydramine]   . Hydrocodone-Acetaminophen    There are no problems to display for this patient.  Social History   Socioeconomic History  . Marital status: Married    Spouse name: Not on file  . Number of children: Not on file  . Years of education: Not on file  . Highest education level: Not on file  Occupational History  . Not on file  Tobacco Use  . Smoking status: Never Smoker  . Smokeless tobacco: Never Used  Substance and Sexual Activity  . Alcohol use: Not on file    Comment: rarely  . Drug use: Never  . Sexual activity: Not on file  Other Topics Concern  . Not on file  Social History  Narrative  . Not on file   Social Determinants of Health   Financial Resource Strain:   . Difficulty of Paying Living Expenses:   Food Insecurity:   . Worried About Charity fundraiser in the Last Year:   . Arboriculturist in the Last Year:   Transportation Needs:   . Film/video editor (Medical):   Marland Kitchen Lack of Transportation (Non-Medical):   Physical Activity:   . Days of Exercise per Week:   . Minutes of Exercise per Session:   Stress:   . Feeling of Stress :   Social Connections:     . Frequency of Communication with Friends and Family:   . Frequency of Social Gatherings with Friends and Family:   . Attends Religious Services:   . Active Member of Clubs or Organizations:   . Attends Archivist Meetings:   Marland Kitchen Marital Status:   Intimate Partner Violence:   . Fear of Current or Ex-Partner:   . Emotionally Abused:   Marland Kitchen Physically Abused:   . Sexually Abused:     Sara Mann family history includes Hypertension in her daughter; Ovarian cancer in her mother.      Objective:    Vitals:   05/27/19 1117  BP: 116/66  Pulse: 76  Temp: 97.9 F (36.6 C)    Physical Exam Well-developed well-nourished older white female in no acute distress.  Height, Weight, 233 BMI 38.8  HEENT; nontraumatic normocephalic, EOMI, PER R LA, sclera anicteric. Oropharynx; not examined Neck; supple, no JVD Cardiovascular; regular rate and rhythm with S1-S2, no murmur rub or gallop Pulmonary; Clear bilaterally Abdomen; soft, nontender, nondistended, no palpable mass or hepatosplenomegaly, bowel sounds are active Rectal; not done today Skin; benign exam, no jaundice rash or appreciable lesions Extremities; no clubbing cyanosis or edema skin warm and dry Neuro/Psych; alert and oriented x4, grossly nonfocal mood and affect appropriate       Assessment & Plan:   #63 64 year old white female with new diagnosis of mild pan ulcerative colitis with biopsies from the right colon and sigmoid colon showing chronic mildly active colitis. Patient has had problems with postprandial urgency and diarrhea on a fairly frequent basis but usually relates this to eating out.  No daily symptoms with loose stools or diarrhea no hematochezia, she denies any abdominal pain.  #2 chronic fatigue 3 status post cholecystectomy #4 obesity  Plan; start Lialda 1.2 g p.o. twice daily. Start trial of Levsin sublingual 1/2-hour AC when eating out. CBC, sed rate CRP and c-Met. We will plan office  follow-up with Dr. Tarri Glenn in 3 months or sooner as needed.  Jontue Crumpacker Genia Harold PA-C 05/27/2019   Cc: Rocco Serene, MD

## 2019-05-27 NOTE — Patient Instructions (Addendum)
If you are age 66 or older, your body mass index should be between 23-30. Your Body mass index is 38.81 kg/m. If this is out of the aforementioned range listed, please consider follow up with your Primary Care Provider.  If you are age 11 or younger, your body mass index should be between 19-25. Your Body mass index is 38.81 kg/m. If this is out of the aformentioned range listed, please consider follow up with your Primary Care Provider.   Your provider has requested that you go to the basement level for lab work before leaving today. Press "B" on the elevator. The lab is located at the first door on the left as you exit the elevator.  We have sent the following medications to your pharmacy for you to pick up at your convenience: Lialda Levsin  Start Miralax 17 gm in 8 ounces of water - take 2 doses today. Then, 1-2 doses daily until bowels move back to normal.  Follow up with Dr. Tarri Glenn on 08/27/19 at 11:10 am.  Thank you for choosing me and [facility].   Amy Esterwood, PA-C

## 2019-05-28 NOTE — Progress Notes (Signed)
Reviewed and agree with management plans. Would recommend fecal calprotectin prior to starting treatment. Thank you.   Ricardo Schubach L. Tarri Glenn, MD, MPH

## 2019-06-03 ENCOUNTER — Other Ambulatory Visit: Payer: Self-pay

## 2019-06-03 DIAGNOSIS — K51919 Ulcerative colitis, unspecified with unspecified complications: Secondary | ICD-10-CM

## 2019-08-27 ENCOUNTER — Ambulatory Visit: Payer: Medicare Other | Admitting: Gastroenterology

## 2019-08-27 ENCOUNTER — Other Ambulatory Visit (INDEPENDENT_AMBULATORY_CARE_PROVIDER_SITE_OTHER): Payer: Medicare Other

## 2019-08-27 ENCOUNTER — Encounter: Payer: Self-pay | Admitting: Gastroenterology

## 2019-08-27 VITALS — BP 130/82 | HR 62 | Ht 61.5 in | Wt 233.0 lb

## 2019-08-27 DIAGNOSIS — K51919 Ulcerative colitis, unspecified with unspecified complications: Secondary | ICD-10-CM

## 2019-08-27 LAB — C-REACTIVE PROTEIN: CRP: 1 mg/dL (ref 0.5–20.0)

## 2019-08-27 LAB — SEDIMENTATION RATE: Sed Rate: 20 mm/hr (ref 0–30)

## 2019-08-27 MED ORDER — DICYCLOMINE HCL 20 MG PO TABS: 20.0000 mg | ORAL_TABLET | Freq: Four times a day (QID) | ORAL | 1 refills | Status: DC

## 2019-08-27 NOTE — Patient Instructions (Addendum)
I have recommended some labs today to monitor for ongoing inflammation. Please have these labs drawn today and 1-2 weeks before your next office visit.   Please take Lialda 2.4 grams every morning.   I recommend that you avoid all anti-inflammatory medications, as these can trigger colitis and make it worse.   I recommend that you use sunscreen and sun-protective clothing whenever you are outside.  Keeping your bones healthy is important. I recommend that you take daily calcium in addition to your Vitamin D supplements. Weight bearing exercise is also good for your bones.   I would be happy to refer you to the [facility].   I recommend that you go to the [facility] website for additional information about achieving good health.   Let's plan to see each other again in 3-4 months. But, please call me earlier with any questions or concerns.   Your provider has requested that you go to the basement level for lab work before leaving today and again 1 week before your next appointment in 3-4 months.  Press "B" on the elevator. The lab is located at the first door on the left as you exit the elevator.  Follow-up in 3-4 months. Office will call with an appointment. You will need labs 1 week prior to your next appointment. (ESR, CRP, fecal Calprotectin)   Thank you for choosing me and [facility].  Dr. Thornton Park

## 2019-08-27 NOTE — Progress Notes (Signed)
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    - colon biopsies show right colon and sigmoid colon 04/2019    - started Lialda 1.2 g BID 05/2019 BMI 43.31 Daily meloxican x 8 weeks GERD History of hemorrhoids diagnosed by her OB/GYN Fear of colonoscopy Prior cholecystectomy Chronic fatigue   PLAN: - ESR, CRP, and fecal calprotectin today and one week before her next visit to monitor for disease activity - Lialda 2.4g QAM - Dicylcomine 20 mg QID PRN - Follow-up in 3-4 months, earlier if needed - Low threshold for follow-up colonoscopy when clinical remission is acheived - I encouraged her to add daily calcium supplements - Consider referral to [facility] as she asked about diet and weight loss options   HPI: Sara Mann is a 64 y.o. female who was initially referred for a positive fit. She has arthritis, hyperlipidemia, hypertension, anxiety, depression, and obesity with a BMI of 43.  She is a self-employed Programmer, applications.   Labs from 01/17/2019 show a positive fecal fit test. She denied any symptoms that time except for burning and itching hemorrhoids and a history of reflux.  She has previously had a cholecystectomy and has experienced some post-prandial diarrhea since that time.   Colonoscopy done on 04/22/2019 with finding of external hemorrhoids.  There was altered vascularity and erythematous ulcerated mucosa in the distal sigmoid colon and in the ascending colon.  Biopsies were taken.  Biopsies from the right colon showed chronic mildly active colitis, biopsies from transverse colon no active inflammation, random left-sided biopsies showed quiescent colitis and biopsy from the distal sigmoid shows chronic moderately active colitis, rectal biopsy showed no active colitis.  Seen in the  office 05/27/19 when she reported constipation following the colonoscopy and fatigue. No diarrhea, bleeding, or loose stools.   Started Lialda 1.2 g BID and Levsin sublingual. CMP and CBC were normal. CRP 16.660 and ESR 38.   Returns today in scheduled follow-up. Using Lialda QAM. Did not take the second dose because she felt it was causing urgency. Using stool softeners as needed. Less fatigue and she wonders if it could be related to her antidepressant change.   COVID negative Viral infection in May with nausea, vomting, and diarrhea.  Continues to have some postprandial urgency most often after eating out.  Having a bowel movement every other day. She considers normal bowel habits to be every day to every couple of days.   Some dry eyes that are worse at the end of the day. No other extra GI manifestations of IBD.  Good appetite. 2 pound weight gain since her last visit. Improved energy.  No NSAIDs. Now using Tylenol.   Normal Bone denisty 04/2019 Mammogram recently 04/2019 Annual flu vaccine Completed Covid Takes vitamin D daily  Mother with ovarian cancer.  No known family history of colon cancer or polyps. No family history of uterine/endometrial cancer, pancreatic cancer or gastric/stomach cancer.   Past Medical History:  Diagnosis Date  . Anxiety   . Arthritis   . Depression   . 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 Hyperlipidemia   . Low back pain with right-sided sciatica   . Morbidly obese ([place])   . Right sided sciatica     Past Surgical History:  Procedure Laterality Date  .  CHOLECYSTECTOMY      Current Outpatient Medications  Medication Sig Dispense Refill  . hydrochlorothiazide (HYDRODIURIL) 12.5 MG tablet Take 12.5 mg by mouth daily.    . hyoscyamine (LEVSIN SL) 0.125 MG SL tablet Take 1/2 hour before meal as needed for urgency/diarrhea 60 tablet 2  . levocetirizine (XYZAL) 5 MG tablet Take 5 mg by mouth every evening.    .  mesalamine (LIALDA) 1.2 g EC tablet Take 1 tablet (1.2 g total) by mouth in the morning and at bedtime. 60 tablet 11  . venlafaxine XR (EFFEXOR-XR) 75 MG 24 hr capsule Take 75 mg by mouth daily.     Current Facility-Administered Medications  Medication Dose Route Frequency Provider Last Rate Last Admin  . 0.9 %  sodium chloride infusion  500 mL Intravenous Once Thornton Park, MD        Allergies as of 08/27/2019 - Review Complete 05/27/2019  Allergen Reaction Noted  . Benadryl [diphenhydramine]  01/05/2019  . Hydrocodone-acetaminophen  01/05/2019    Family History  Problem Relation Age of Onset  . Ovarian cancer Mother   . Hypertension Daughter   . Colon cancer Neg Hx   . Rectal cancer Neg Hx   . Stomach cancer Neg Hx   . Esophageal cancer Neg Hx     Social History   Socioeconomic History  . Marital status: Married    Spouse name: Not on file  . Number of children: Not on file  . Years of education: Not on file  . Highest education level: Not on file  Occupational History  . Not on file  Tobacco Use  . Smoking status: Never Smoker  . Smokeless tobacco: Never Used  Substance and Sexual Activity  . Alcohol use: Not on file    Comment: rarely  . Drug use: Never  . Sexual activity: Not on file  Other Topics Concern  . Not on file  Social History Narrative  . Not on file   Social Determinants of Health   Financial Resource Strain:   . Difficulty of Paying Living Expenses:   Food Insecurity:   . Worried About Charity fundraiser in the Last Year:   . Arboriculturist in the Last Year:   Transportation Needs:   . Film/video editor (Medical):   Marland Kitchen Lack of Transportation (Non-Medical):   Physical Activity:   . Days of Exercise per Week:   . Minutes of Exercise per Session:   Stress:   . Feeling of Stress :   Social Connections:   . Frequency of Communication with Friends and Family:   . Frequency of Social Gatherings with Friends and Family:   . Attends  Religious Services:   . Active Member of Clubs or Organizations:   . Attends Archivist Meetings:   Marland Kitchen Marital Status:   Intimate Partner Violence:   . Fear of Current or Ex-Partner:   . Emotionally Abused:   Marland Kitchen Physically Abused:   . Sexually Abused:    Physical Exam: General:   Alert,  well-nourished, pleasant and cooperative in NAD Head:  Normocephalic and atraumatic. Eyes:  Sclera clear, no icterus.   Conjunctiva pink. Ears:  Normal auditory acuity. Nose:  No deformity, discharge,  or lesions. Mouth:  No deformity or lesions.   Neck:  Supple; no masses or thyromegaly. Lungs:  Clear throughout to auscultation.   No wheezes. Heart:  Regular rate and rhythm; no murmurs. Abdomen:  Soft,nontender, nondistended, normal bowel sounds, no rebound  or guarding. No hepatosplenomegaly.   Rectal:  Deferred  Msk:  Symmetrical. No boney deformities LAD: No inguinal or umbilical LAD Extremities:  No clubbing or edema. Neurologic:  Alert and  oriented x4;  grossly nonfocal Skin:  Intact without significant lesions or rashes. Psych:  Alert and cooperative. Normal mood and affect.    Kerby Hockley L. Tarri Glenn, MD, MPH 08/27/2019, 11:26 AM

## 2019-08-29 ENCOUNTER — Encounter: Payer: Self-pay | Admitting: Gastroenterology

## 2020-08-21 ENCOUNTER — Other Ambulatory Visit: Payer: Self-pay | Admitting: Gastroenterology

## 2020-08-21 DIAGNOSIS — K51919 Ulcerative colitis, unspecified with unspecified complications: Secondary | ICD-10-CM

## 2021-03-23 ENCOUNTER — Other Ambulatory Visit: Payer: Self-pay | Admitting: Registered Nurse

## 2021-03-23 DIAGNOSIS — Z1231 Encounter for screening mammogram for malignant neoplasm of breast: Secondary | ICD-10-CM

## 2021-04-06 ENCOUNTER — Ambulatory Visit
Admission: RE | Admit: 2021-04-06 | Discharge: 2021-04-06 | Disposition: A | Discharge status: Home / Self Care | Payer: Medicare Other | Source: Ambulatory Visit | Attending: Registered Nurse | Admitting: Registered Nurse

## 2021-04-06 DIAGNOSIS — Z1231 Encounter for screening mammogram for malignant neoplasm of breast: Secondary | ICD-10-CM

## 2021-08-06 NOTE — Progress Notes (Unsigned)
08/09/2021 Sara Mann [id number] Nov 22, 1955  Referring provider: No ref. provider found Primary GI doctor: Dr. Tarri Glenn  ASSESSMENT AND PLAN:   Ulcerative colitis with complication, unspecified location [facility])  off her mesalamine for year with some chronic progressive RLQ pain and alternating diarrhea/constipation/urgency KUB 2 views and CT AB/pelvis with contrast to evaluate for any complications of UC with symptoms Check Cdiff  to rule out infectious diarrhea, doubltful infectious Check inflammatory labs, Fecal calprotectin, CBC, CMET, CRP. Will check Hep B and TB Gold Patient will eventually need updated colonoscopy Will refill mesalamine to start AFTER labs return can consider prednisone pending labs  IBGARD and FODMAP given Get shingles vaccine, counseled on UC and disease process -     CBC with Differential/Platelet; Future -     Basic metabolic panel; Future -     Hepatic function panel; Future -     High sensitivity CRP; Future -     Sedimentation rate; Future -     CALPROTECTIN; Future -     Hepatitis B surface antigen; Future -     QuantiFERON-TB Gold Plus; Future -     DG Abd 2 Views; Future -     CT Abdomen Pelvis W Contrast; Future -     mesalamine (LIALDA) 1.2 g EC tablet; Take 1 tablet (1.2 g total) by mouth in the morning and at bedtime.  Alternating constipation and diarrhea -     High sensitivity CRP; Future -     Sedimentation rate; Future -     CALPROTECTIN; Future - I believe this is more constipation with overflow diarrhea - mild concern for obstruction/UC compliactions with progressive pain, will proceed with KUB and CT scan  Screening for viral disease/TB -     Hepatitis B surface antigen; Future -     QuantiFERON-TB Gold Plus; Future   History of Present Illness:  66 y.o. 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 Last seen in the office on 08/27/2019 by Dr. Tarri Glenn.    IBD history:Diagnosed 04/22/2019 after  colonoscopy for positive fit test  Surgical history: no surgery.  Current medications and last dose:  Started on Lialda 1.2 g twice daily  Levisin switched to Dicyclomine last visit in July which she states she did not like  Last colonoscopy: 04/22/2019, external hemorrhoids, altered vascularity/erythematous ulcerated mucosa distal sigmoid colon ascending colon biopsy showed chronic mildly active colitis Last small bowel imaging: None Extraintestinal manifestations: The patient has not had any extraintestinal symptoms Other medical history: Status post cholecystectomy some postprandial diarrhea BMI 43   Recent labs: 08/27/2019 CRP 1.0 (16 05/2019) 08/27/2019 SED RATE 20 (38 05/2019) Fecal cal not done 05/27/2019 WBC 8.6 HGB 13.9 MCV 89.6 Platelets 310.0 05/27/2019 AST 18 ALT 25 Alkphos 71 TBili 0.8  Current History Has not been seen since 08/2019. Was on mesalamine, prescription ran out and has been off of it for at least year.   She has had AB pain for at least 8 months, progressively getting worse. She has right lower AB pain, was babysitting and picking up toddler made it worse. Constipation makes it worse, feels better with bowel movement.  She alternates constipation and diarrhea, has been having more diarrhea in the last 2-4 weeks. Can have diarrhea up to 3 x a day and then won't have BM for 2-3 days with bloating and occ will take colace, rare laxity. She has urgency, no nocturnal issues no fecal incontinence.  Denies hematochezia.  No nausea and vomiting  except with recent vertigo episode Sunday.  She has been more fatigued.  She has diarrhea with eating out at restaurants.  Denies fever, chills. No ABX in last 6 months.  No weight loss.  Wt Readings from Last 3 Encounters:  08/09/21 230 lb (104.3 kg)  08/27/19 (!) 233 lb (105.7 kg)  05/27/19 233 lb 3.2 oz (105.8 kg)    IBD Health Care Maintenance: Annual Flu Vaccine - completed Pneumococcal Vaccine if receiving  immunosuppression: -  discussed TB testing if on anti-TNF- discussed Vitamin D screening -takes daily  COVID vaccine - completed Shingrix - discussed   Current Medications:     Current Outpatient Medications (Cardiovascular):    amlodipine-atorvastatin (CADUET) 2.5-10 MG tablet, Take 1 tablet by mouth daily.   atorvastatin (LIPITOR) 10 MG tablet, Take 10 mg by mouth daily.   Current Outpatient Medications (Respiratory):    levocetirizine (XYZAL) 5 MG tablet, Take 5 mg by mouth every evening.     Current Outpatient Medications (Hematological):    Ferrous Gluconate-C-Folic Acid (IRON-C PO), Take by mouth.   vitamin B-12 (CYANOCOBALAMIN) 500 MCG tablet, Take 500 mcg by mouth daily.   Current Outpatient Medications (Other):    Biotin 1 MG CAPS, Take by mouth.   calcium carbonate (TUMS - DOSED IN MG ELEMENTAL CALCIUM) 500 MG chewable tablet, Chew 1 tablet by mouth daily.   citalopram (CELEXA) 20 MG tablet, Take 20 mg by mouth daily.   l-methylfolate-B6-B12 (METANX) 3-35-2 MG TABS tablet, Take 1 tablet by mouth daily.   magnesium hydroxide (MILK OF MAGNESIA) 400 MG/5ML suspension, Take 240 mLs by mouth daily as needed for mild constipation.   Cholecalciferol (VITAMIN D3) 1.25 MG (50000 UT) CAPS, Take by mouth.   mesalamine (LIALDA) 1.2 g EC tablet, Take 1 tablet (1.2 g total) by mouth in the morning and at bedtime.  Current Facility-Administered Medications (Other):    0.9 %  sodium chloride infusion  Surgical History:  She  has a past surgical history that includes Cholecystectomy. Family History:  Her family history includes Hypertension in her daughter; Ovarian cancer in her mother. Social History:   reports that she has never smoked. She has never used smokeless tobacco. She reports current alcohol use. She reports that she does not use drugs.  Current Medications, Allergies, Past Medical History, Past Surgical History, Family History and Social History were reviewed in  [employer] record.  Physical Exam: BP 140/80   Pulse 65   Ht 5' 1"  (1.549 m)   Wt 230 lb (104.3 kg)   BMI 43.46 kg/m  General:   Pleasant, well developed female in no acute distress Heart : Regular rate and rhythm; no murmurs Pulm: Clear anteriorly; no wheezing Abdomen:  Soft, Obese AB, Active bowel sounds. moderate tenderness in the RLQ. With guarding and Without rebound, No organomegaly appreciated. Rectal: Normal external rectal exam, normal rectal tone, no internal hemorrhoids appreciated, no masses, non tender, brown stool, hemoccult Negative Extremities:  without  edema. Neurologic:  Alert and  oriented x4;  No focal deficits.  Psych:  Cooperative. Normal mood and affect.   Vladimir Crofts, PA-C 08/09/21

## 2021-08-09 ENCOUNTER — Other Ambulatory Visit (INDEPENDENT_AMBULATORY_CARE_PROVIDER_SITE_OTHER): Payer: Medicare Other

## 2021-08-09 ENCOUNTER — Ambulatory Visit (INDEPENDENT_AMBULATORY_CARE_PROVIDER_SITE_OTHER)
Admission: RE | Admit: 2021-08-09 | Discharge: 2021-08-09 | Disposition: A | Discharge status: Home / Self Care | Payer: Medicare Other | Source: Ambulatory Visit | Attending: Physician Assistant | Admitting: Physician Assistant

## 2021-08-09 ENCOUNTER — Ambulatory Visit: Payer: Medicare Other | Admitting: Physician Assistant

## 2021-08-09 ENCOUNTER — Encounter: Payer: Self-pay | Admitting: Physician Assistant

## 2021-08-09 VITALS — BP 140/80 | HR 65 | Ht 61.0 in | Wt 230.0 lb

## 2021-08-09 DIAGNOSIS — Z1159 Encounter for screening for other viral diseases: Secondary | ICD-10-CM | POA: Diagnosis not present

## 2021-08-09 DIAGNOSIS — R1031 Right lower quadrant pain: Secondary | ICD-10-CM

## 2021-08-09 DIAGNOSIS — R198 Other specified symptoms and signs involving the digestive system and abdomen: Secondary | ICD-10-CM

## 2021-08-09 DIAGNOSIS — K51919 Ulcerative colitis, unspecified with unspecified complications: Secondary | ICD-10-CM

## 2021-08-09 DIAGNOSIS — A09 Infectious gastroenteritis and colitis, unspecified: Secondary | ICD-10-CM

## 2021-08-09 DIAGNOSIS — Z111 Encounter for screening for respiratory tuberculosis: Secondary | ICD-10-CM

## 2021-08-09 LAB — CBC WITH DIFFERENTIAL/PLATELET
Basophils Absolute: 0.1 10*3/uL (ref 0.0–0.1)
Basophils Relative: 1.5 % (ref 0.0–3.0)
Eosinophils Absolute: 0.2 10*3/uL (ref 0.0–0.7)
Eosinophils Relative: 2.3 % (ref 0.0–5.0)
HCT: 41.3 % (ref 36.0–46.0)
Hemoglobin: 13.8 g/dL (ref 12.0–15.0)
Lymphocytes Relative: 34.2 % (ref 12.0–46.0)
Lymphs Abs: 2.7 10*3/uL (ref 0.7–4.0)
MCHC: 33.4 g/dL (ref 30.0–36.0)
MCV: 87.1 fl (ref 78.0–100.0)
Monocytes Absolute: 0.6 10*3/uL (ref 0.1–1.0)
Monocytes Relative: 7.3 % (ref 3.0–12.0)
Neutro Abs: 4.3 10*3/uL (ref 1.4–7.7)
Neutrophils Relative %: 54.7 % (ref 43.0–77.0)
Platelets: 286 10*3/uL (ref 150.0–400.0)
RBC: 4.74 Mil/uL (ref 3.87–5.11)
RDW: 14 % (ref 11.5–15.5)
WBC: 7.9 10*3/uL (ref 4.0–10.5)

## 2021-08-09 LAB — HEPATIC FUNCTION PANEL
ALT: 24 U/L (ref 0–35)
AST: 17 U/L (ref 0–37)
Albumin: 4.1 g/dL (ref 3.5–5.2)
Alkaline Phosphatase: 87 U/L (ref 39–117)
Bilirubin, Direct: 0.2 mg/dL (ref 0.0–0.3)
Total Bilirubin: 1.3 mg/dL — ABNORMAL HIGH (ref 0.2–1.2)
Total Protein: 7 g/dL (ref 6.0–8.3)

## 2021-08-09 LAB — BASIC METABOLIC PANEL
BUN: 7 mg/dL (ref 6–23)
CO2: 29 mEq/L (ref 19–32)
Calcium: 9.3 mg/dL (ref 8.4–10.5)
Chloride: 102 mEq/L (ref 96–112)
Creatinine, Ser: 0.7 mg/dL (ref 0.40–1.20)
GFR: 89.24 mL/min (ref >60.00)
Glucose, Bld: 99 mg/dL (ref 70–99)
Potassium: 3.7 mEq/L (ref 3.5–5.1)
Sodium: 137 mEq/L (ref 135–145)

## 2021-08-09 LAB — SEDIMENTATION RATE: Sed Rate: 30 mm/hr (ref 0–30)

## 2021-08-09 LAB — HIGH SENSITIVITY CRP: CRP, High Sensitivity: 8.55 mg/L — ABNORMAL HIGH (ref 0.000–5.000)

## 2021-08-09 MED ORDER — MESALAMINE 1.2 G PO TBEC: 1.2000 g | DELAYED_RELEASE_TABLET | Freq: Two times a day (BID) | ORAL | 3 refills | Status: DC

## 2021-08-09 NOTE — Patient Instructions (Addendum)
We have sent the following medications to your pharmacy for you to pick up at your convenience: [place] provider has requested that you go to the basement level for lab work before leaving today. Press "B" on the elevator. The lab is located at the first door on the left as you exit the elevator.  Your provider has requested that you go to the basement level for an Xray of your abdomen before leaving today.  Press "B" on the elevator.  The lab is located at the first door on the left as you exit the elevator.  Please follow up in 3 months. Give [place] a call at [phone] to schedule an appointment.   You have been scheduled for a CT scan of the abdomen and pelvis at [facility], 1st floor Radiology. You are scheduled on 08/17/21  at 330 pm. You should arrive 15 minutes prior to your appointment time for registration.  Please pick up 2 bottles of contrast from [place] at least 3 days prior to your scan. The solution may taste better if refrigerated, but do NOT add ice or any other liquid to this solution. Shake well before drinking.   Please follow the written instructions below on the day of your exam:   1) Do not eat anything after 1130 am (4 hours prior to your test)   2) Drink 1 bottle of contrast @ 130 pm (2 hours prior to your exam)  Remember to shake well before drinking and do NOT pour over ice.     Drink 1 bottle of contrast @ 230 pm (1 hour prior to your exam)   You may take any medications as prescribed with a small amount of water, if necessary. If you take any of the following medications: METFORMIN, GLUCOPHAGE, GLUCOVANCE, AVANDAMET, RIOMET, FORTAMET, [place] MET, JANUMET, GLUMETZA or METAGLIP, you MAY be asked to HOLD this medication 48 hours AFTER the exam.   The purpose of you drinking the oral contrast is to aid in the visualization of your intestinal tract. The contrast solution may cause some diarrhea. Depending on your individual set of symptoms, you may  also receive an intravenous injection of x-ray contrast/dye. Plan on being at [facility] for 45 minutes or longer, depending on the type of exam you are having performed.   If you have any questions regarding your exam or if you need to reschedule, you may call [facility] at [phone] between the hours of 8:00 am and 5:00 pm, Monday-Friday.      Please try low FODMAP diet Try trial off milk/lactose products.  Add fiber like benefiber or citracel once a day Increase activity Can do trial of IBGard for AB pain-  Take 1-2 capsules once a day for maintence or twice a day during a flare Please try to decrease stress. if any worsening symptoms like blood in stool, weight loss, please call the office or go to the ER.     FODMAP stands for fermentable oligo-, di-, mono-saccharides and polyols (1). These are the scientific terms used to classify groups of carbs that are notorious for triggering digestive symptoms like bloating, gas and stomach pain.   FODMAPs are found in a wide range of foods in varying amounts. Some foods contain just one type, while others contain several.  The main dietary sources of the four groups of FODMAPs include:  Oligosaccharides: Wheat, rye, legumes and various fruits and vegetables, such as garlic and onions.  Disaccharides: Milk, yogurt and  soft cheese. Lactose is the main carb.  Monosaccharides: Various fruit including figs and mangoes, and sweeteners such as honey and agave nectar. Fructose is the main carb.  Polyols: Certain fruits and vegetables including blackberries and lychee, as well as some low-calorie sweeteners like those in sugar-free gum.   Keep a food diary. This will help you identify foods that cause symptoms. Write down: What you eat and when. What symptoms you have. When symptoms occur in relation to your meals. Avoid foods that cause symptoms. Talk with your dietitian about other ways to get the same nutrients that are in  these foods. Eat your meals slowly, in a relaxed setting. Aim to eat 5-6 small meals per day. Do not skip meals. Drink enough fluids to keep your urine clear or pale yellow. If dairy products cause your symptoms to flare up, try eating less of them. You might be able to handle yogurt better than other dairy products because it contains bacteria that help with digestion.

## 2021-08-10 ENCOUNTER — Other Ambulatory Visit: Payer: Medicare Other

## 2021-08-10 DIAGNOSIS — A09 Infectious gastroenteritis and colitis, unspecified: Secondary | ICD-10-CM

## 2021-08-10 NOTE — Progress Notes (Signed)
Reviewed and agree with management plans. ? ?Khadija Thier L. Delio Slates, MD, MPH  ?

## 2021-08-13 LAB — HEPATITIS B SURFACE ANTIGEN: Hepatitis B Surface Ag: NONREACTIVE

## 2021-08-13 LAB — QUANTIFERON-TB GOLD PLUS
Mitogen-NIL: >10 IU/mL
NIL: 0.04 IU/mL
QuantiFERON-TB Gold Plus: NEGATIVE
TB1-NIL: 0.01 IU/mL
TB2-NIL: 0.01 IU/mL

## 2021-08-13 LAB — CLOSTRIDIUM DIFFICILE TOXIN B, QUALITATIVE, REAL-TIME PCR: Toxigenic C. Difficile by PCR: NOT DETECTED

## 2021-08-16 LAB — CALPROTECTIN: Calprotectin: 438 mcg/g — ABNORMAL HIGH

## 2021-08-17 ENCOUNTER — Other Ambulatory Visit (HOSPITAL_COMMUNITY): Payer: Medicare Other

## 2021-08-20 ENCOUNTER — Ambulatory Visit (HOSPITAL_COMMUNITY)
Admission: RE | Admit: 2021-08-20 | Discharge: 2021-08-20 | Disposition: A | Discharge status: Home / Self Care | Payer: Medicare Other | Source: Ambulatory Visit | Attending: Physician Assistant | Admitting: Physician Assistant

## 2021-08-20 DIAGNOSIS — K51919 Ulcerative colitis, unspecified with unspecified complications: Secondary | ICD-10-CM | POA: Insufficient documentation

## 2021-08-20 DIAGNOSIS — R1031 Right lower quadrant pain: Secondary | ICD-10-CM | POA: Insufficient documentation

## 2021-08-20 MED ORDER — IOHEXOL 300 MG/ML  SOLN
100.0000 mL | Freq: Once | INTRAMUSCULAR | Status: AC | PRN
  Administered 2021-08-20: 100 mL via INTRAVENOUS

## 2021-08-20 MED ORDER — SODIUM CHLORIDE (PF) 0.9 % IJ SOLN
INTRAMUSCULAR | Status: AC
  Filled 2021-08-20: qty 50

## 2021-09-03 ENCOUNTER — Ambulatory Visit
Admission: EM | Admit: 2021-09-03 | Discharge: 2021-09-03 | Disposition: A | Discharge status: Home / Self Care | Payer: Medicare Other

## 2021-09-03 DIAGNOSIS — K0889 Other specified disorders of teeth and supporting structures: Secondary | ICD-10-CM

## 2021-09-03 MED ORDER — AMOXICILLIN 500 MG PO CAPS: 500.0000 mg | ORAL_CAPSULE | Freq: Three times a day (TID) | ORAL | 0 refills | Status: DC

## 2021-09-03 MED ORDER — TRAMADOL HCL 50 MG PO TABS: 50.0000 mg | ORAL_TABLET | Freq: Two times a day (BID) | ORAL | 0 refills | Status: DC | PRN

## 2021-09-03 NOTE — ED Triage Notes (Signed)
Pt reports dental pain x 3 days. Naproxen gives no relief.   Pt requested Naproxen dose to be increase.

## 2021-09-03 NOTE — Discharge Instructions (Addendum)
Take medication as prescribed. Warm salt water gargles 3-4 times daily while symptoms persist. Cool compresses to the right side of the face for pain or swelling.  May apply warm compresses to help with pain or discomfort.  Apply for 20 minutes, remove for 1 hour, then repeat as needed. Follow-up in the emergency department immediately if you develop worsening dental pain, fever, chills, increased facial swelling, or other concerns. Follow-up with your dentist within the next 7 to 10 days.

## 2021-09-03 NOTE — ED Provider Notes (Signed)
[facility]    CSN: [id number] Arrival date & time: 09/03/21  1123      History   Chief Complaint Chief Complaint  Patient presents with   Dental Pain    HPI Sara Mann is a 66 y.o. female.   The history is provided by the patient.   Patient presents for complaints of dental pain that started approximately 2 to 3 days ago.  Pain is located in the right upper portion of the patient's mouth.  Patient states she had a crown on the tooth in question, but the crown has since fallen off.  She complains of facial swelling, pain with eating, talking, and chewing.  She denies fever, chills, chest pain, abdominal pain, nausea, vomiting, or diarrhea.  Patient states she has been using naproxen and Tylenol for her symptoms.  States that she did contact her dentist but he was out of town.  Patient states that she was advised to go to urgent care to get antibiotics started.  She states she is scheduled to see her dentist next week.  Patient reports a history of ulcerative colitis, but states it is under control at this time.  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   Past Surgical History:  Procedure Laterality Date   CHOLECYSTECTOMY      OB History   No obstetric history on file.      Home Medications    Prior to Admission medications   Medication Sig Start Date End Date Taking? Authorizing Provider  amoxicillin (AMOXIL) 500 MG capsule Take 1 capsule (500 mg total) by mouth 3 (three) times daily. 09/03/21  Yes Merina Behrendt-Warren, Alda Lea, NP  naproxen sodium (ALEVE) 220 MG tablet Take 220 mg by mouth.   Yes [provider]  traMADol (ULTRAM) 50 MG tablet Take 1 tablet (50 mg  total) by mouth every 12 (twelve) hours as needed for severe pain. 09/03/21  Yes Avya Flavell-Warren, Alda Lea, NP  amlodipine-atorvastatin (CADUET) 2.5-10 MG tablet Take 1 tablet by mouth daily.    [provider]  atorvastatin (LIPITOR) 10 MG tablet Take 10 mg by mouth daily.    [provider]  Biotin 1 MG CAPS Take by mouth.    [provider]  calcium carbonate (TUMS - DOSED IN MG ELEMENTAL CALCIUM) 500 MG chewable tablet Chew 1 tablet by mouth daily.    [provider]  Cholecalciferol (VITAMIN D3) 1.25 MG (50000 UT) CAPS Take by mouth.    [provider]  citalopram (CELEXA) 20 MG tablet Take 20 mg by mouth daily.    [provider]  Ferrous Gluconate-C-Folic Acid (IRON-C PO) Take by mouth.    [provider]  l-methylfolate-B6-B12 (METANX) 3-35-2 MG TABS tablet Take 1 tablet by mouth daily.    [provider]  levocetirizine (XYZAL) 5 MG tablet Take 5 mg by mouth every evening.    [provider]  magnesium hydroxide (MILK OF MAGNESIA) 400 MG/5ML suspension Take 240 mLs by mouth daily as needed for mild constipation.    [provider]  mesalamine (LIALDA) 1.2 g EC tablet Take 1 tablet (1.2 g total)  by mouth in the morning and at bedtime. 08/09/21   Vladimir Crofts, PA-C  vitamin B-12 (CYANOCOBALAMIN) 500 MCG tablet Take 500 mcg by mouth daily.    [provider]    Family History Family History  Problem Relation Age of Onset   Ovarian cancer Mother    Hypertension Daughter    Colon cancer Neg Hx    Rectal cancer Neg Hx    Stomach cancer Neg Hx    Esophageal cancer Neg Hx     Social History Social History   Tobacco Use   Smoking status: Never   Smokeless tobacco: Never  Vaping Use   Vaping Use: Never used  Substance Use Topics   Alcohol use: Yes    Comment: rarely   Drug use: Never     Allergies   Benadryl [diphenhydramine] and Hydrocodone-acetaminophen   Review of  Systems Review of Systems Per HPI  Physical Exam Triage Vital Signs ED Triage Vitals  Enc Vitals Group     BP 09/03/21 1230 (!) 156/82     Pulse Rate 09/03/21 1230 70     Resp 09/03/21 1230 18     Temp 09/03/21 1230 98.2 F (36.8 C)     Temp Source 09/03/21 1230 Oral     SpO2 09/03/21 1230 95 %     Weight --      Height --      Head Circumference --      Peak Flow --      Pain Score 09/03/21 1229 8     Pain Loc --      Pain Edu? --      Excl. in [place]? --    No data found.  Updated Vital Signs BP (!) 156/82 (BP Location: Right Arm)   Pulse 70   Temp 98.2 F (36.8 C) (Oral)   Resp 18   SpO2 95%   Visual Acuity Right Eye Distance:   Left Eye Distance:   Bilateral Distance:    Right Eye Near:   Left Eye Near:    Bilateral Near:     Physical Exam Vitals and nursing note reviewed.  Constitutional:      General: She is not in acute distress.    Appearance: Normal appearance.  HENT:     Head: Normocephalic.     Nose: Nose normal.     Mouth/Throat:     Mouth: Mucous membranes are moist.     Dentition: Abnormal dentition. Dental tenderness and gingival swelling present.     Pharynx: Oropharynx is clear. Uvula midline. No pharyngeal swelling, oropharyngeal exudate, posterior oropharyngeal erythema or uvula swelling.     Tonsils: No tonsillar exudate.      Comments: Dental tenderness and abnormality to tooth #5, first bicuspid, mild gingival swelling noted. Eyes:     Extraocular Movements: Extraocular movements intact.     Conjunctiva/sclera: Conjunctivae normal.     Pupils: Pupils are equal, round, and reactive to light.  Cardiovascular:     Rate and Rhythm: Normal rate and regular rhythm.  Pulmonary:     Effort: Pulmonary effort is normal.  Abdominal:     General: Bowel sounds are normal.  Musculoskeletal:     Cervical back: Normal range of motion.  Lymphadenopathy:     Cervical: No cervical adenopathy.  Skin:    General: Skin is warm and dry.   Neurological:     General: No focal deficit present.     Mental Status: She is alert and oriented  to person, place, and time.  Psychiatric:        Mood and Affect: Mood normal.        Behavior: Behavior normal.      UC Treatments / Results  Labs (all labs ordered are listed, but only abnormal results are displayed) Labs Reviewed - No data to display  EKG   Radiology No results found.  Procedures Procedures (including critical care time)  Medications Ordered in UC Medications - No data to display  Initial Impression / Assessment and Plan / UC Course  I have reviewed the triage vital signs and the nursing notes.  Pertinent labs & imaging results that were available during my care of the patient were reviewed by me and considered in my medical decision making (see chart for details).  Patient presents with dental pain has been present for the past 2 to 3 days.  Dental pain related to tooth #5.  On exam, there is dental tenderness and dental caries present.  We will treat patient with amoxicillin at this time.  She does have a history of ulcerative colitis, so we will provide a prescription for tramadol for her pain control.  Supportive care recommendations were provided to the patient along with strict indications of when to go to the emergency department.  Patient advised to follow-up as needed. Final Clinical Impressions(s) / UC Diagnoses   Final diagnoses:  Dentalgia     Discharge Instructions      Take medication as prescribed. Warm salt water gargles 3-4 times daily while symptoms persist. Cool compresses to the right side of the face for pain or swelling.  May apply warm compresses to help with pain or discomfort.  Apply for 20 minutes, remove for 1 hour, then repeat as needed. Follow-up in the emergency department immediately if you develop worsening dental pain, fever, chills, increased facial swelling, or other concerns. Follow-up with your dentist within the  next 7 to 10 days.     ED Prescriptions     Medication Sig Dispense Auth. Provider   amoxicillin (AMOXIL) 500 MG capsule Take 1 capsule (500 mg total) by mouth 3 (three) times daily. 21 capsule Thaniel Coluccio-Warren, Alda Lea, NP   traMADol (ULTRAM) 50 MG tablet Take 1 tablet (50 mg total) by mouth every 12 (twelve) hours as needed for severe pain. 15 tablet Zephaniah Enyeart-Warren, Alda Lea, NP      I have reviewed the PDMP during this encounter.   Tish Men, NP 09/03/21 1247

## 2021-10-16 ENCOUNTER — Other Ambulatory Visit: Payer: Self-pay | Admitting: Otolaryngology

## 2021-10-16 DIAGNOSIS — H903 Sensorineural hearing loss, bilateral: Secondary | ICD-10-CM

## 2021-10-16 DIAGNOSIS — H9311 Tinnitus, right ear: Secondary | ICD-10-CM

## 2021-11-05 ENCOUNTER — Ambulatory Visit
Admission: RE | Admit: 2021-11-05 | Discharge: 2021-11-05 | Disposition: A | Discharge status: Home / Self Care | Payer: Medicare Other | Source: Ambulatory Visit | Attending: Otolaryngology | Admitting: Otolaryngology

## 2021-11-05 DIAGNOSIS — H9311 Tinnitus, right ear: Secondary | ICD-10-CM

## 2021-11-05 DIAGNOSIS — H903 Sensorineural hearing loss, bilateral: Secondary | ICD-10-CM

## 2021-11-05 MED ORDER — GADOPICLENOL 0.5 MMOL/ML IV SOLN
10.0000 mL | Freq: Once | INTRAVENOUS | Status: AC | PRN
  Administered 2021-11-05: 10 mL via INTRAVENOUS

## 2021-11-27 NOTE — Progress Notes (Signed)
11/29/2021 Sara Mann [id number] 1955-02-06  Referring provider: No ref. provider found Primary GI doctor: Dr. Orvan Falconer  ASSESSMENT AND PLAN:   Ulcerative colitis without complication with AB pain right lower AB KUB did show mild retained fecal matter, started on MiraLAX. CT abdomen pelvis without acute process  Fecal calprotectin 438, hs-CRP 8.5, previously 2 years ago 16 Restarted on mesalamine twice daily, has had 3 months.  Patient still with some right flank/lower abdominal discomfort can be worse with movement, daily, no real associated symptoms.  Possible spasm versus continuing inflammation versus musculoskeletal. Check inflammatory labs, may need to increase mesalamine 4 tablets daily. Given Bentyl for abdominal spasms and information on FODMAP diet. Follow-up with primary care for back, can try lidocaine patches. Follow-up 3 to 4 months with Dr. Orvan Falconer, may have to consider repeat colonoscopy to evaluate for mucosal healing. Last colonoscopy 04/22/2019 at date of diagnosis with mildly active colitis  Hepatomegaly Seen on CT abdomen pelvis Normal liver function Likely more related to fatty liver. Monitor LFTs every 6 months, get hepatocellular work-up if elevated.  Morbid obesity  Body mass index is 44.59 kg/m.  -Patient has been advised to make an attempt to improve diet and exercise patterns to aid in weight loss. -Recommended diet heavy in fruits and veggies and low in animal meats, cheeses, and dairy products, appropriate calorie intake   History of Present Illness:  66 y.o. 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 Last seen in the office on 08/09/2021 by myself  IBD history:Diagnosed 04/22/2019 after colonoscopy for positive fit test  Surgical history: no surgery.  Current medications and last dose:  Started on Lialda 1.2 g twice daily  Levisin switched to Dicyclomine last visit in July which she states she did not like Had  1 year off Lialda with worsening symptoms KUB showed mild retained fecal material without obstruction, CT showed hepatomegaly, diverticulosis without inflammation, mucosal fat throughout colon but no acute process.  Last colonoscopy: 04/22/2019, external hemorrhoids, altered vascularity/erythematous ulcerated mucosa distal sigmoid colon ascending colon biopsy showed chronic mildly active colitis Last small bowel imaging: 08/20/2021 CT abdomen pelvis with contrast for abdominal pain after being off Lialda for a year showed hepatomegaly, colonic diverticulosis, prominent submucosal fat throughout the colon most significantly right colon nons specific, no acute process. Extraintestinal manifestations: The patient has not had any extraintestinal symptoms Other medical history: Status post cholecystectomy some postprandial diarrhea BMI 43   Recent labs: 08/2021 CRP 8 (16 05/2019) 08/2021 SED RATE 20  08/2021 Fecal cal 438 08/09/2021 TB GOLD NEGATIVE 08/09/2021  HepBsAG NON-REACTIVE  08/09/2021 WBC 7.9 HGB 13.8 MCV 87.1   Current History  08/09/2021 patient seen in the office, last seen a year prior. Was on mesalamine, prescription ran out and has been off of with return of symptoms. KUB showed mild retained fecal matter, CT abdomen pelvis showed hepatomegaly, diverticulosis, prominent fat throughout colon but no acute process. Fecal calprotectin 438, hs-CRP 8.5, previously 2 years ago 16) ESR unremarkable. Patient had no anemia no leukocytosis. C. difficile negative. Restarted on Lialda 1.2 g twice daily and MiraLAX for constipation..  Patient states she had diarrhea 2 weeks ago for 3 days but for the most part once or twice a day, formed stools, Denies hematochezia.  Continues with daily AB pain No nausea and vomiting except with vertigo.  Denies fever, chills. No ABX in last 6 months.  No weight loss.  Wt Readings from Last 3 Encounters:  11/29/21 236 lb (107  kg)  08/09/21 230 lb (104.3 kg)   08/27/19 (!) 233 lb (105.7 kg)    IBD Health Care Maintenance: Annual Flu Vaccine - needs to get done Pneumococcal Vaccine if receiving immunosuppression: -  discussed TB testing if on anti-TNF- discussed Vitamin D screening -takes daily  COVID vaccine - completed Shingrix - discussed   Current Medications:     Current Outpatient Medications (Cardiovascular):    amlodipine-atorvastatin (CADUET) 2.5-10 MG tablet, Take 1 tablet by mouth daily.   atorvastatin (LIPITOR) 10 MG tablet, Take 10 mg by mouth daily.   Current Outpatient Medications (Respiratory):    levocetirizine (XYZAL) 5 MG tablet, Take 5 mg by mouth every evening.   Current Outpatient Medications (Analgesics):    naproxen sodium (ALEVE) 220 MG tablet, Take 220 mg by mouth.   Current Outpatient Medications (Hematological):    Ferrous Gluconate-C-Folic Acid (IRON-C PO), Take by mouth.   vitamin B-12 (CYANOCOBALAMIN) 500 MCG tablet, Take 500 mcg by mouth daily.   Current Outpatient Medications (Other):    Biotin 1 MG CAPS, Take by mouth.   calcium carbonate (TUMS - DOSED IN MG ELEMENTAL CALCIUM) 500 MG chewable tablet, Chew 1 tablet by mouth daily.   Cholecalciferol (VITAMIN D3) 1.25 MG (50000 UT) CAPS, Take by mouth.   citalopram (CELEXA) 20 MG tablet, Take 20 mg by mouth daily.   dicyclomine (BENTYL) 20 MG tablet, Take 1 tablet (20 mg total) by mouth 3 (three) times daily as needed for spasms.   magnesium hydroxide (MILK OF MAGNESIA) 400 MG/5ML suspension, Take 240 mLs by mouth daily as needed for mild constipation.   mesalamine (LIALDA) 1.2 g EC tablet, Take 1 tablet (1.2 g total) by mouth in the morning and at bedtime.  Current Facility-Administered Medications (Other):    0.9 %  sodium chloride infusion  Surgical History:  She  has a past surgical history that includes Cholecystectomy. Family History:  Her family history includes Hypertension in her daughter; Ovarian cancer in her mother. Social  History:   reports that she has never smoked. She has never used smokeless tobacco. She reports current alcohol use. She reports that she does not use drugs.  Current Medications, Allergies, Past Medical History, Past Surgical History, Family History and Social History were reviewed in [employer] record.  Physical Exam: BP 136/80   Pulse 63   Ht 5\' 1"  (1.549 m)   Wt 236 lb (107 kg)   SpO2 96%   BMI 44.59 kg/m  General:   Pleasant, well developed female in no acute distress Heart : Regular rate and rhythm; no murmurs Pulm: Clear anteriorly; no wheezing Abdomen:  Soft, Obese AB, Active bowel sounds. moderate tenderness in the RLQ. With guarding and Without rebound, No organomegaly appreciated. Rectal:not evaluagted Extremities:  without  edema. Neurologic:  Alert and  oriented x4;  No focal deficits.  Psych:  Cooperative. Normal mood and affect.   Doree Albee, PA-C 11/29/21

## 2021-11-29 ENCOUNTER — Ambulatory Visit: Payer: Medicare Other | Admitting: Physician Assistant

## 2021-11-29 ENCOUNTER — Encounter: Payer: Self-pay | Admitting: Physician Assistant

## 2021-11-29 ENCOUNTER — Other Ambulatory Visit (INDEPENDENT_AMBULATORY_CARE_PROVIDER_SITE_OTHER): Payer: Medicare Other

## 2021-11-29 VITALS — BP 136/80 | HR 63 | Ht 61.0 in | Wt 236.0 lb

## 2021-11-29 DIAGNOSIS — K51919 Ulcerative colitis, unspecified with unspecified complications: Secondary | ICD-10-CM | POA: Diagnosis not present

## 2021-11-29 DIAGNOSIS — R1031 Right lower quadrant pain: Secondary | ICD-10-CM | POA: Diagnosis not present

## 2021-11-29 DIAGNOSIS — G8929 Other chronic pain: Secondary | ICD-10-CM | POA: Diagnosis not present

## 2021-11-29 LAB — HIGH SENSITIVITY CRP: CRP, High Sensitivity: 3.41 mg/L (ref 0.000–5.000)

## 2021-11-29 MED ORDER — MESALAMINE 1.2 G PO TBEC: 1.2000 g | DELAYED_RELEASE_TABLET | Freq: Two times a day (BID) | ORAL | 3 refills | Status: DC

## 2021-11-29 MED ORDER — DICYCLOMINE HCL 20 MG PO TABS: 20.0000 mg | ORAL_TABLET | Freq: Three times a day (TID) | ORAL | 0 refills | Status: DC | PRN

## 2021-11-29 NOTE — Patient Instructions (Addendum)
_______________________________________________________  If you are age 68 or older, your body mass index should be between 23-30. Your Body mass index is 44.59 kg/m. If this is out of the aforementioned range listed, please consider follow up with your Primary Care Provider.  If you are age 44 or younger, your body mass index should be between 19-25. Your Body mass index is 44.59 kg/m. If this is out of the aformentioned range listed, please consider follow up with your Primary Care Provider.   ________________________________________________________  The [place] [facility] providers would like to encourage you to use [facility] to communicate with providers for non-urgent requests or questions.  Due to long hold times on the telephone, sending your provider a message by [facility] may be a faster and more efficient way to get a response.  Please allow 48 business hours for a response.  Please remember that this is for non-urgent requests.  _______________________________________________________  Please follow up in 3-4 months Dr Tarri Glenn. Give [place] a call at [phone] to schedule an appointment.  Your provider has requested that you go to the basement level for lab work before leaving today. Press "B" on the elevator. The lab is located at the first door on the left as you exit the elevator.   First do a trial off milk/lactose products if you use them.  Add fiber like benefiber or citracel once a day Increase activity Can do trial of IBGard which is over the counter for AB pain- Take 1-2 capsules once a day for maintence or twice a day during a flare Can send in an anti spasm medication, Bentyl, to take as needed  No aleve, ibuprofen, goody powders, as these are antiinflammatories and can cause inflammation in your stomach, increase bleeding risk and cause ulcers.  You can talk with PCP about alternative pain options.  Can do tyelnol max 3000 mg a day, salon pas patches are over the counter and voltern  gel is topical antiinflammatory that is safe.   Please try low FODMAP diet- see below- start with eliminating just one column at a time, the table at the very bottom contains foods that are safe to take   FODMAP stands for fermentable oligo-, di-, mono-saccharides and polyols (1). These are the scientific terms used to classify groups of carbs that are notorious for triggering digestive symptoms like bloating, gas and stomach pain.

## 2021-11-29 NOTE — Progress Notes (Signed)
Reviewed and agree with management plans. ? ?Sara Mann L. Waynetta Metheny, MD, MPH  ?

## 2021-12-12 ENCOUNTER — Other Ambulatory Visit: Payer: Medicare Other

## 2021-12-12 DIAGNOSIS — G8929 Other chronic pain: Secondary | ICD-10-CM

## 2021-12-12 DIAGNOSIS — K51919 Ulcerative colitis, unspecified with unspecified complications: Secondary | ICD-10-CM

## 2021-12-15 LAB — CALPROTECTIN: Calprotectin: 29 mcg/g

## 2022-05-16 ENCOUNTER — Other Ambulatory Visit: Payer: Self-pay | Admitting: Physician Assistant

## 2022-05-16 DIAGNOSIS — K51919 Ulcerative colitis, unspecified with unspecified complications: Secondary | ICD-10-CM

## 2022-07-02 ENCOUNTER — Telehealth: Payer: Self-pay | Admitting: Physician Assistant

## 2022-07-02 DIAGNOSIS — K51919 Ulcerative colitis, unspecified with unspecified complications: Secondary | ICD-10-CM

## 2022-07-02 MED ORDER — MESALAMINE 1.2 G PO TBEC: DELAYED_RELEASE_TABLET | ORAL | 3 refills | Status: DC

## 2022-07-02 NOTE — Telephone Encounter (Signed)
Inbound call from patient, would like a refill sent of Mesalamine, sent to [facility].

## 2022-07-02 NOTE — Telephone Encounter (Signed)
Done

## 2022-07-27 IMAGING — MG MM DIGITAL SCREENING BILAT W/ TOMO AND CAD
8 series · 8 of 24 positions shown · non-contrast
Comparison: Previous exam(s).

CLINICAL DATA: Screening.

EXAM:
DIGITAL SCREENING BILATERAL MAMMOGRAM WITH TOMOSYNTHESIS AND CAD
TECHNIQUE: Bilateral screening digital craniocaudal and mediolateral oblique
mammograms were obtained. Bilateral screening digital breast
tomosynthesis was performed. The images were evaluated with
computer-aided detection.

[L CC synth-2D]
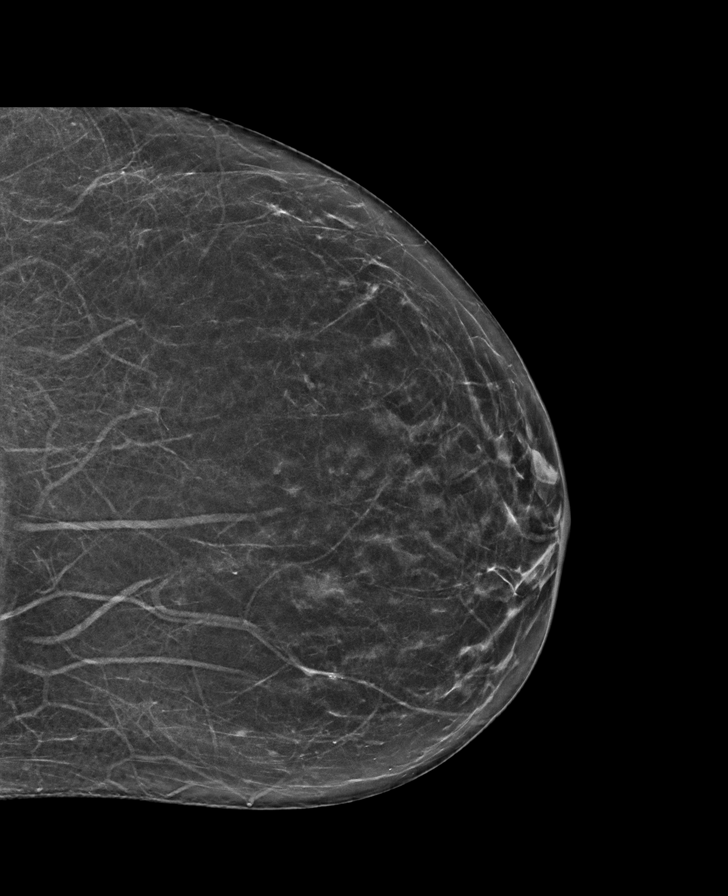

[R MLO synth-2D]
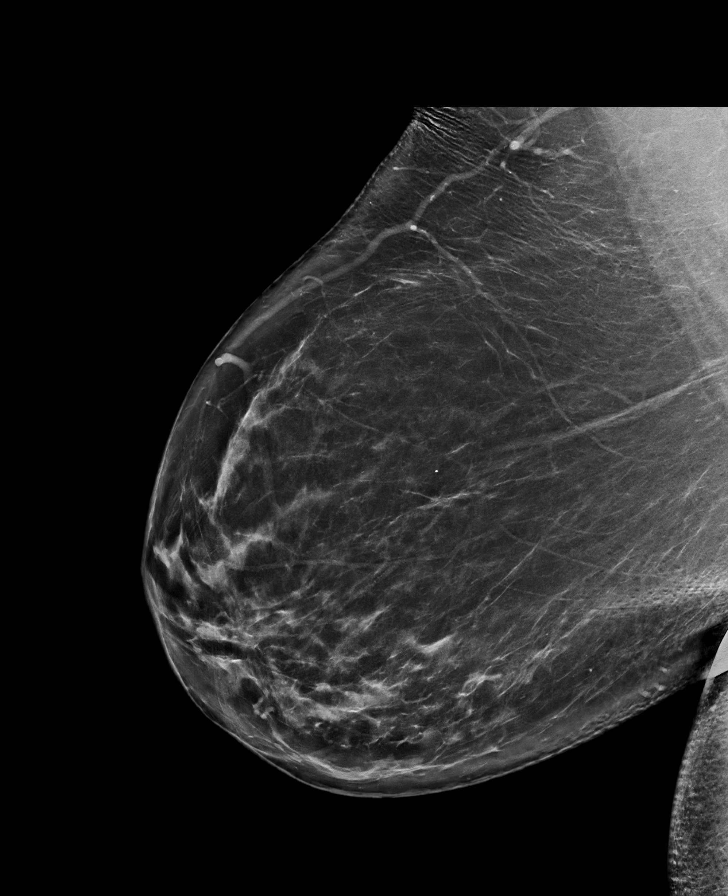

[R CC synth-2D]
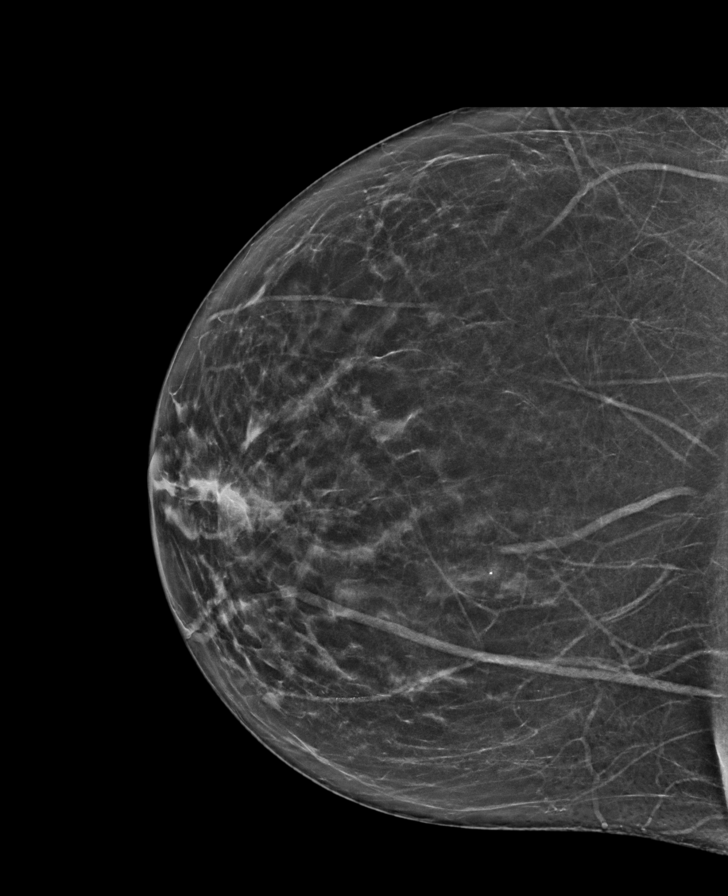

[L MLO synth-2D]
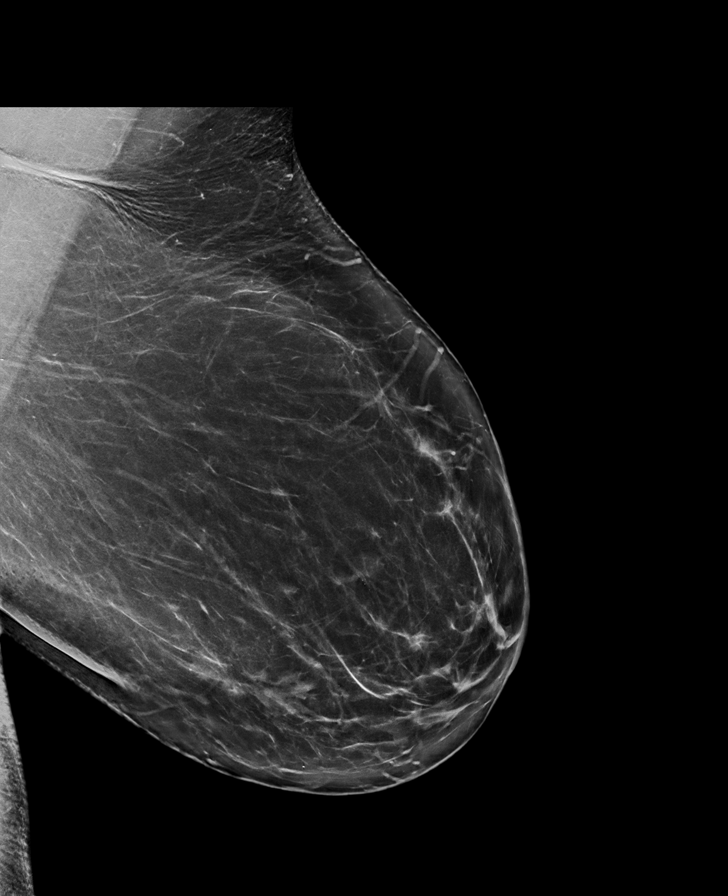

[L MLO tomo · tomo slice 44/87.0]
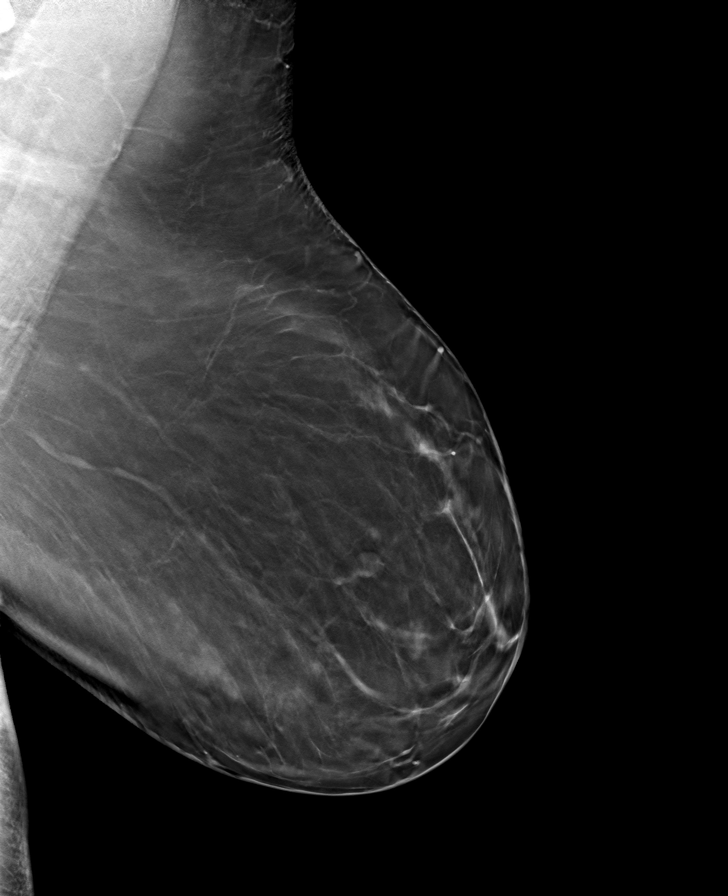

[L CC tomo · tomo slice 32/63.0]
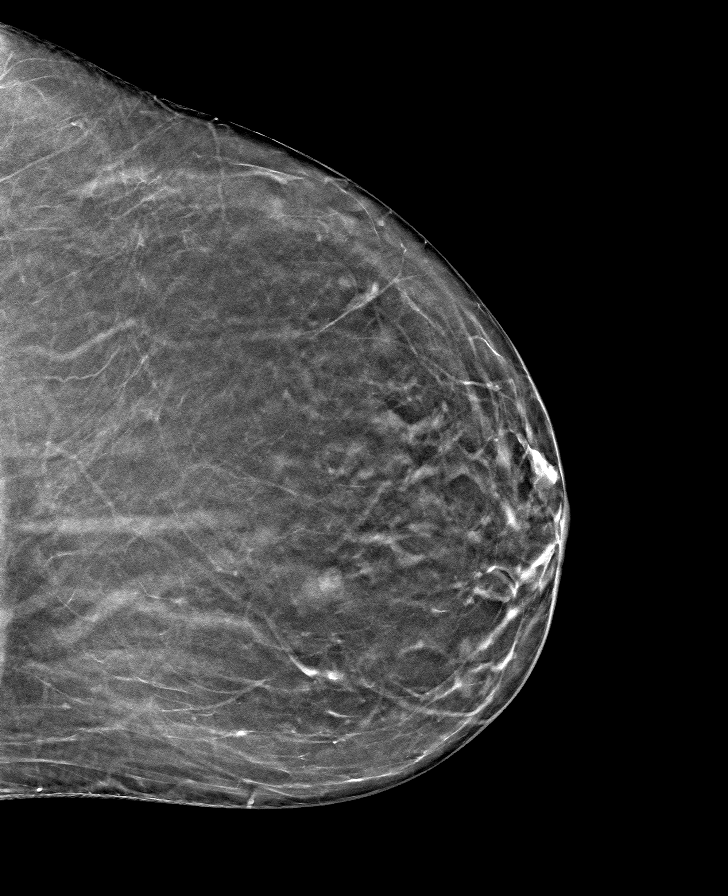

[R MLO tomo · tomo slice 45/89.0]
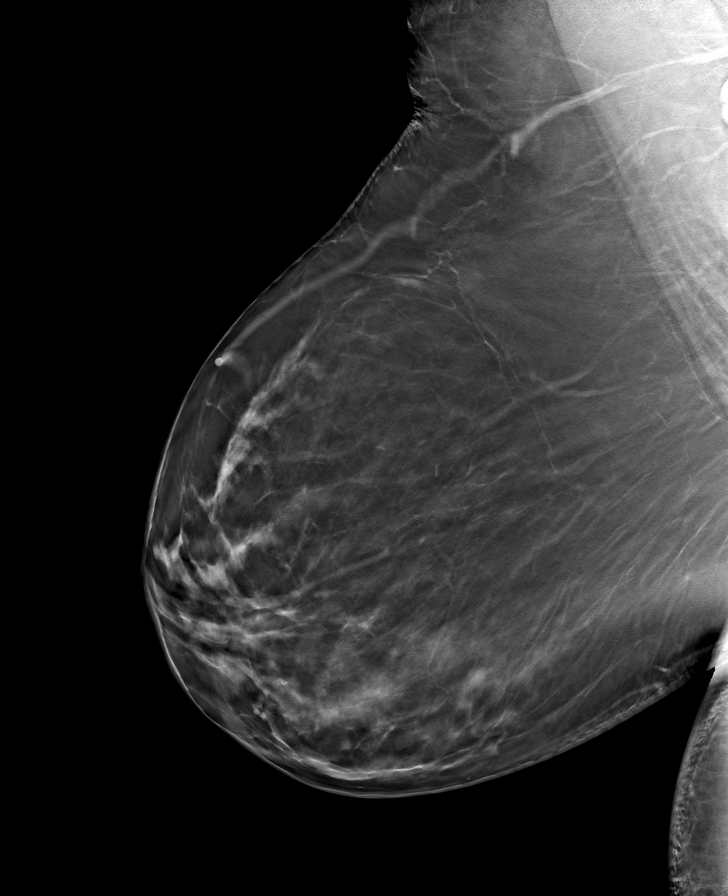

[R CC tomo · tomo slice 34/67.0]
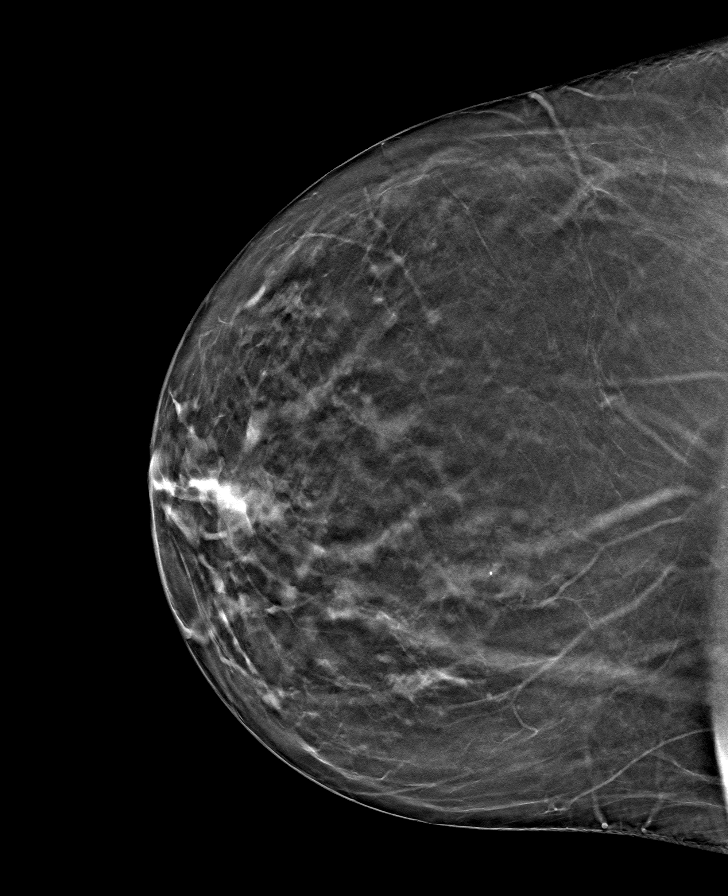

[8 of 24 positions shown; findings below may reference images not displayed]

ACR Breast Density Category b: There are scattered areas of
fibroglandular density.
FINDINGS: There are no findings suspicious for malignancy.
IMPRESSION: No mammographic evidence of malignancy. A result letter of this
screening mammogram will be mailed directly to the patient.

RECOMMENDATION:
Screening mammogram in one year. (Code:51-O-LD2)

BI-RADS CATEGORY  1: Negative.

## 2022-08-12 ENCOUNTER — Other Ambulatory Visit (INDEPENDENT_AMBULATORY_CARE_PROVIDER_SITE_OTHER): Payer: Medicare Other

## 2022-08-12 ENCOUNTER — Encounter: Payer: Self-pay | Admitting: Gastroenterology

## 2022-08-12 ENCOUNTER — Ambulatory Visit: Payer: Medicare Other | Admitting: Gastroenterology

## 2022-08-12 VITALS — BP 130/70 | HR 61 | Ht 61.0 in | Wt 233.0 lb

## 2022-08-12 DIAGNOSIS — K589 Irritable bowel syndrome without diarrhea: Secondary | ICD-10-CM | POA: Diagnosis not present

## 2022-08-12 DIAGNOSIS — K219 Gastro-esophageal reflux disease without esophagitis: Secondary | ICD-10-CM

## 2022-08-12 DIAGNOSIS — K51919 Ulcerative colitis, unspecified with unspecified complications: Secondary | ICD-10-CM | POA: Diagnosis not present

## 2022-08-12 DIAGNOSIS — K760 Fatty (change of) liver, not elsewhere classified: Secondary | ICD-10-CM

## 2022-08-12 LAB — CBC WITH DIFFERENTIAL/PLATELET
Basophils Absolute: 0.1 10*3/uL (ref 0.0–0.1)
Basophils Relative: 1.5 % (ref 0.0–3.0)
Eosinophils Absolute: 0 10*3/uL (ref 0.0–0.7)
Eosinophils Relative: 0.1 % (ref 0.0–5.0)
HCT: 41.8 % (ref 36.0–46.0)
Hemoglobin: 13.8 g/dL (ref 12.0–15.0)
Lymphocytes Relative: 36.3 % (ref 12.0–46.0)
Lymphs Abs: 2.5 10*3/uL (ref 0.7–4.0)
MCHC: 33 g/dL (ref 30.0–36.0)
MCV: 88.4 fl (ref 78.0–100.0)
Monocytes Absolute: 0.6 10*3/uL (ref 0.1–1.0)
Monocytes Relative: 8.2 % (ref 3.0–12.0)
Neutro Abs: 3.7 10*3/uL (ref 1.4–7.7)
Neutrophils Relative %: 53.9 % (ref 43.0–77.0)
Platelets: 315 10*3/uL (ref 150.0–400.0)
RBC: 4.73 Mil/uL (ref 3.87–5.11)
RDW: 13.8 % (ref 11.5–15.5)
WBC: 6.9 10*3/uL (ref 4.0–10.5)

## 2022-08-12 LAB — COMPREHENSIVE METABOLIC PANEL
ALT: 29 U/L (ref 0–35)
AST: 21 U/L (ref 0–37)
Albumin: 4.1 g/dL (ref 3.5–5.2)
Alkaline Phosphatase: 83 U/L (ref 39–117)
BUN: 8 mg/dL (ref 6–23)
CO2: 28 mEq/L (ref 19–32)
Calcium: 9.6 mg/dL (ref 8.4–10.5)
Chloride: 102 mEq/L (ref 96–112)
Creatinine, Ser: 0.84 mg/dL (ref 0.40–1.20)
GFR: 71.2 mL/min (ref >60.00)
Glucose, Bld: 107 mg/dL — ABNORMAL HIGH (ref 70–99)
Potassium: 4.3 mEq/L (ref 3.5–5.1)
Sodium: 138 mEq/L (ref 135–145)
Total Bilirubin: 1.7 mg/dL — ABNORMAL HIGH (ref 0.2–1.2)
Total Protein: 7.6 g/dL (ref 6.0–8.3)

## 2022-08-12 LAB — TSH: TSH: 4.16 u[IU]/mL (ref 0.35–5.50)

## 2022-08-12 LAB — C-REACTIVE PROTEIN: CRP: <1 mg/dL (ref 0.5–20.0)

## 2022-08-12 MED ORDER — DICYCLOMINE HCL 20 MG PO TABS: 20.0000 mg | ORAL_TABLET | Freq: Four times a day (QID) | ORAL | 3 refills | Status: AC | PRN

## 2022-08-12 MED ORDER — PANTOPRAZOLE SODIUM 40 MG PO TBEC: 40.0000 mg | DELAYED_RELEASE_TABLET | Freq: Every day | ORAL | 4 refills | Status: AC

## 2022-08-12 MED ORDER — MESALAMINE 1.2 G PO TBEC: 1.2000 g | DELAYED_RELEASE_TABLET | Freq: Two times a day (BID) | ORAL | 3 refills | Status: DC

## 2022-08-12 NOTE — Progress Notes (Signed)
Chief Complaint: FU  Referring Provider:  No ref. provider found      ASSESSMENT AND PLAN;   #1. GERD  #2. pUC- Dx 2021.  #3. IBS w/t alt diarrhea/constipation  #4. Fatty liver with Nl LFTs on CT 08/2021  Plan: -Continue Lialda 1.2g BID -CBC, CMP, CRP, TSH, celiac screen -Stool for calprotectin, GI pathogens -Heating pads for musculoskeletal component of pain. -Refill Bentyl 20mg  po QID prn #120 -Trial of Protonix 40mg  po QAM #90 -Encouraged wt loss -FU 18 weeks.  If still with problems, EGD and further WU.   HPI:    Sara Mann is a 67 y.o. female  Patient of Dr. Orvan Falconer Dx with pUC on colon 04/2019, maintained on Lialda 1.2 g BID, IBS w/t alt diarrhea/constipation, fatty liver on CT with Nl LFTs, GERD, obesity, s/p cholecystectomy  C/O intermittent diarrhea -after eating -bouts -1-2/in 1-2 weeks.  Not very frequent.  With associated constipation with pellet-like stools -Diarrhea occurs after eating fried foods -No nocturnal symptoms or blood -mesalinine has helped.  So has Bentyl.  Wants refills  Abd pain -RMQ pain -Worst after eating and also associated with back pain -Never crosses midline. -No previous history of shingles -Has associated heartburn, nausea -No significant nonsteroidals -No weight loss -Never had EGD -CT reviewed independently and with patient  No weight loss.  No skin rash.  No fever chills or night sweats.   Review of previous notes:  IBD history:  Dx with panUC 04/2019 after colon for positive fit test  Surgical history: no surgery.  Current medications and last dose:  Started on Lialda 1.2 g twice daily  Last colonoscopy: 04/22/2019, external hemorrhoids, altered vascularity/erythematous ulcerated mucosa distal sigmoid colon ascending colon biopsy showed chronic mildly active colitis. Rpt 58yrs/PRN Last small bowel imaging: 08/20/2021 CT abdomen pelvis with contrast for abdominal pain after being off Lialda for a year  showed hepatomegaly, colonic diverticulosis, prominent submucosal fat throughout the colon most significantly right colon nons specific, no acute process. 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    Hyperlipidemia    Low back pain with right-sided sciatica    Morbidly obese (HCC)    Right sided sciatica     Past Surgical History:  Procedure Laterality Date   CHOLECYSTECTOMY      Family History  Problem Relation Age of Onset   Ovarian cancer Mother    Hypertension Daughter    Colon cancer Neg Hx    Rectal cancer Neg Hx    Stomach cancer Neg Hx    Esophageal cancer Neg Hx     Social History   Tobacco Use   Smoking status: Never   Smokeless tobacco: Never  Vaping Use   Vaping Use: Never used  Substance Use Topics   Alcohol use: Yes    Comment: rarely   Drug use: Never    Current Outpatient Medications  Medication Sig Dispense Refill   amlodipine-atorvastatin (CADUET) 2.5-10 MG tablet Take 1 tablet by mouth daily.     atorvastatin (LIPITOR) 10 MG tablet Take 10 mg by mouth daily.     Biotin  1 MG CAPS Take by mouth.     calcium carbonate (TUMS - DOSED IN MG ELEMENTAL CALCIUM) 500 MG chewable tablet Chew 1 tablet by mouth daily.     Cholecalciferol (VITAMIN D3) 1.25 MG (50000 UT) CAPS Take by mouth.     citalopram (CELEXA) 20 MG tablet Take 20 mg by mouth daily.     dicyclomine (BENTYL) 20 MG tablet Take 1 tablet (20 mg total) by mouth 3 (three) times daily as needed for spasms. 50 tablet 0   Ferrous Gluconate-C-Folic Acid (IRON-C PO) Take by mouth.     levocetirizine (XYZAL) 5 MG tablet  Take 5 mg by mouth every evening.     magnesium hydroxide (MILK OF MAGNESIA) 400 MG/5ML suspension Take 240 mLs by mouth daily as needed for mild constipation.     mesalamine (LIALDA) 1.2 g EC tablet TAKE 1 TABLET BY MOUTH IN THE MORNING AND AT BEDTIME 60 tablet 3   naproxen sodium (ALEVE) 220 MG tablet Take 220 mg by mouth.     OVER THE COUNTER MEDICATION Pt taking a time release B12 1000 mcg     OVER THE COUNTER MEDICATION Magnesium 500 mg pt taking 1 a day     OVER THE COUNTER MEDICATION Pt taking fish oil 1200 mg pt taking 1 a day     vitamin B-12 (CYANOCOBALAMIN) 500 MCG tablet Take 500 mcg by mouth daily. (Patient not taking: Reported on 08/12/2022)     Current Facility-Administered Medications  Medication Dose Route Frequency Provider Last Rate Last Admin   0.9 %  sodium chloride infusion  500 mL Intravenous Once Tressia Danas, MD        Allergies  Allergen Reactions   Benadryl [Diphenhydramine]    Hydrocodone-Acetaminophen     Review of Systems:  Constitutional: Denies fever, chills, diaphoresis, appetite change and has fatigue.  HEENT:neg  Respiratory: Denies SOB, DOE, cough, chest tightness,  and wheezing.   Cardiovascular: Denies chest pain, palpitations and leg swelling.  Genitourinary: Denies dysuria, urgency, frequency, hematuria, flank pain and difficulty urinating.  Musculoskeletal: Has myalgias, back pain, joint swelling, arthralgias and gait problem.  Skin: No rash.  Neurological: Denies dizziness, seizures, syncope, weakness, light-headedness, numbness and headaches.  Hematological: Denies adenopathy. Easy bruising, personal or family bleeding history  Psychiatric/Behavioral: Has anxiety or depression     Physical Exam:    BP 130/70   Pulse 61   Ht 5\' 1"  (1.549 m)   Wt 233 lb (105.7 kg)   BMI 44.02 kg/m  Wt Readings from Last 3 Encounters:  08/12/22 233 lb (105.7 kg)  11/29/21 236 lb (107 kg)  08/09/21 230 lb (104.3 kg)   Constitutional:   Well-developed, in no acute distress. Psychiatric: Normal mood and affect. Behavior is normal. HEENT: Pupils normal.  Conjunctivae are normal. No scleral icterus. Cardiovascular: Normal rate, regular rhythm. No edema Pulmonary/chest: Effort normal and breath sounds normal. No wheezing, rales or rhonchi. Abdominal: Soft, nondistended. Nontender. Bowel sounds active throughout. There are no masses palpable. No hepatomegaly. Rectal: Deferred Neurological: Alert and oriented to person place and time. Skin: Skin is warm and dry. No rashes noted.  Data Reviewed: I have personally reviewed following labs and imaging studies  CBC:    Latest Ref Rng & Units 08/09/2021   10:32 AM 05/27/2019    1:22 PM 12/05/2006   10:29 AM  CBC  WBC 4.0 - 10.5 K/uL 7.9  8.6  5.6   Hemoglobin 12.0 - 15.0 g/dL 56.3  87.5  64.3  Hematocrit 36.0 - 46.0 % 41.3  41.3  37.8   Platelets 150.0 - 400.0 K/uL 286.0  310.0  329     CMP:    Latest Ref Rng & Units 08/09/2021   10:32 AM 05/27/2019    1:22 PM 12/05/2006   10:29 AM  CMP  Glucose 70 - 99 mg/dL 99  147  85   BUN 6 - 23 mg/dL 7  10  9    Creatinine 0.40 - 1.20 mg/dL 8.29  5.62  1.30   Sodium 135 - 145 mEq/L 137  141  140   Potassium 3.5 - 5.1 mEq/L 3.7  3.5  4.3   Chloride 96 - 112 mEq/L 102  101  108   CO2 19 - 32 mEq/L 29  32  26   Calcium 8.4 - 10.5 mg/dL 9.3  9.1  9.5   Total Protein 6.0 - 8.3 g/dL 7.0  7.2  6.8   Total Bilirubin 0.2 - 1.2 mg/dL 1.3  0.8  0.8   Alkaline Phos 39 - 117 U/L 87  71  78   AST 0 - 37 U/L 17  18  21    ALT 0 - 35 U/L 24  25  21          Edman Circle, MD 08/12/2022, 9:07 AM  Cc: No ref. provider found

## 2022-08-12 NOTE — Addendum Note (Signed)
Addended by: Alberteen Sam E on: 08/12/2022 02:57 PM   Modules accepted: Orders

## 2022-08-12 NOTE — Patient Instructions (Addendum)
_______________________________________________________  If your blood pressure at your visit was 140/90 or greater, please contact your primary care physician to follow up on this.  _______________________________________________________  If you are age 69 or older, your body mass index should be between 23-30. Your Body mass index is 44.02 kg/m. If this is out of the aforementioned range listed, please consider follow up with your Primary Care Provider.  If you are age 52 or younger, your body mass index should be between 19-25. Your Body mass index is 44.02 kg/m. If this is out of the aformentioned range listed, please consider follow up with your Primary Care Provider.   ________________________________________________________  The [place] [facility] providers would like to encourage you to use [facility] to communicate with providers for non-urgent requests or questions.  Due to long hold times on the telephone, sending your provider a message by [facility] may be a faster and more efficient way to get a response.  Please allow 48 business hours for a response.  Please remember that this is for non-urgent requests.  _______________________________________________________   Your provider has requested that you go to the basement level for lab work before leaving today. Press "B" on the elevator. The lab is located at the first door on the left as you exit the elevator.  Your provider has ordered "Diatherix" stool testing for you. You have received a kit from our office today containing all necessary supplies to complete this test. Please carefully read the stool collection instructions provided in the kit before opening the accompanying materials. In addition, be sure to place the label from the top left corner of the laboratory request sheet onto the "puritan opti-swab" tube that is supplied in the kit. This label should include your full name and date of birth. After completing the test, you should  secure the purtian tube into the specimen biohazard bag. The laboratory request information sheet (including date and time of specimen collection) should be placed into the outside pocket of the specimen biohazard bag and returned to the [place] lab with 2 days of collection.   We have sent the following medications to your pharmacy for you to pick up at your convenience: Bentyl 20mg  4 times a day as needed Protonix 40mg  daily Lialda 1.2g 2 times a day   You have been scheduled for an appointment with Dr. Chales Abrahams on 12-26-2022 at 830am . Please arrive 10 minutes early for your appointment.  Thank you,  Dr. Lynann Bologna

## 2022-08-15 LAB — CALCITONIN: Calcitonin: 182 pg/mL — ABNORMAL HIGH (ref <=5)

## 2022-08-16 NOTE — Progress Notes (Signed)
Please inform the patient. All results normal or at baseline Except elevated serum calcitonin-not sure what to make of it. Need to get in touch with your primary care physician.  If you do not have one, we will be happy to make appointment.  This can be associated with thyroid abnormalities. Send report to family physician

## 2022-08-28 ENCOUNTER — Other Ambulatory Visit: Payer: Medicare Other

## 2022-08-28 DIAGNOSIS — K51919 Ulcerative colitis, unspecified with unspecified complications: Secondary | ICD-10-CM

## 2022-08-28 DIAGNOSIS — K219 Gastro-esophageal reflux disease without esophagitis: Secondary | ICD-10-CM

## 2022-08-28 DIAGNOSIS — K589 Irritable bowel syndrome without diarrhea: Secondary | ICD-10-CM

## 2022-08-28 DIAGNOSIS — K760 Fatty (change of) liver, not elsewhere classified: Secondary | ICD-10-CM

## 2022-09-06 ENCOUNTER — Telehealth: Payer: Self-pay

## 2022-09-06 NOTE — Telephone Encounter (Signed)
LVM for patient and mychart message

## 2022-09-16 ENCOUNTER — Encounter: Payer: Self-pay | Admitting: Gastroenterology

## 2022-10-22 ENCOUNTER — Encounter: Payer: Self-pay | Admitting: Nurse Practitioner

## 2022-12-26 ENCOUNTER — Ambulatory Visit: Payer: Medicare Other | Admitting: Gastroenterology

## 2022-12-26 ENCOUNTER — Encounter: Payer: Self-pay | Admitting: Gastroenterology

## 2022-12-26 VITALS — BP 130/70 | HR 60 | Ht 60.5 in | Wt 236.4 lb

## 2022-12-26 DIAGNOSIS — K51919 Ulcerative colitis, unspecified with unspecified complications: Secondary | ICD-10-CM

## 2022-12-26 DIAGNOSIS — K582 Mixed irritable bowel syndrome: Secondary | ICD-10-CM

## 2022-12-26 DIAGNOSIS — K219 Gastro-esophageal reflux disease without esophagitis: Secondary | ICD-10-CM | POA: Diagnosis not present

## 2022-12-26 DIAGNOSIS — K760 Fatty (change of) liver, not elsewhere classified: Secondary | ICD-10-CM | POA: Diagnosis not present

## 2022-12-26 MED ORDER — PANTOPRAZOLE SODIUM 40 MG PO TBEC: 40.0000 mg | DELAYED_RELEASE_TABLET | Freq: Every day | ORAL | 4 refills | Status: AC

## 2022-12-26 NOTE — Progress Notes (Signed)
Chief Complaint: FU  Referring Provider:  No ref. provider found      ASSESSMENT AND PLAN;   #1. GERD  #2. pUC- Dx 2021.  #3. IBS w/t alt diarrhea/constipation  #4. Fatty liver with Nl LFTs on CT 08/2021  Plan: -Continue Lialda 1.2g BID -Refill Bentyl 20mg  po QID prn #120 -Continue Protonix 40mg  po QAM #90, 4RF -Encouraged wt loss -FU in 1 yr.   HPI:    Sara Mann is a 67 y.o. female  Patient of Dr. Orvan Falconer Dx with pUC on colon 04/2019, maintained on Lialda 1.2 g BID, IBS w/t alt diarrhea/constipation, fatty liver on CT with Nl LFTs, GERD, obesity, s/p cholecystectomy  FU  No problems 1 BM/day No abdo pain   Most recent fecal calprotectin was normal Normal CRP, CBC, CMP  Done very well from reflux standpoint on Protonix.  Her husband takes it as well.  She can try it every other day.  No abdominal pain.  She does use Bentyl on as needed basis 2-3 times per week.  No weight loss.  No skin rash.  No fever chills or night sweats.   Review of previous notes:  IBD history:  Dx with panUC 04/2019 after colon for positive fit test  Surgical history: no surgery.  Current medications and last dose:  Started on Lialda 1.2 g twice daily  Last colonoscopy: 04/22/2019, external hemorrhoids, altered vascularity/erythematous ulcerated mucosa distal sigmoid colon ascending colon biopsy showed chronic mildly active colitis. Rpt 15yrs/PRN Last small bowel imaging: 08/20/2021 CT abdomen pelvis with contrast for abdominal pain after being off Lialda for a year showed hepatomegaly, colonic diverticulosis, prominent submucosal fat throughout the colon most significantly right colon nons specific, no acute process. 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    Hyperlipidemia    Low back pain with right-sided sciatica    Morbidly obese (HCC)    Right sided sciatica     Past Surgical History:  Procedure Laterality Date   CHOLECYSTECTOMY      Family History  Problem Relation Age of Onset   Ovarian cancer Mother    Hypertension Daughter    Colon cancer Neg Hx    Rectal cancer Neg Hx    Stomach cancer Neg Hx    Esophageal cancer Neg Hx     Social History   Tobacco Use   Smoking status: Never   Smokeless tobacco: Never  Vaping Use   Vaping status: Never Used  Substance Use Topics   Alcohol use: Yes    Comment: rarely   Drug use: Never    Current Outpatient Medications  Medication Sig Dispense Refill   amLODipine (NORVASC) 2.5 MG tablet Take 2.5 mg by mouth at bedtime.     atorvastatin (LIPITOR) 10 MG tablet Take 10 mg by mouth daily.     Biotin 1 MG CAPS Take by mouth.     calcium carbonate (TUMS - DOSED IN MG ELEMENTAL CALCIUM) 500 MG chewable tablet Chew 1 tablet by mouth daily.     Cholecalciferol (VITAMIN D3) 1.25 MG (50000 UT) CAPS Take by mouth.  citalopram (CELEXA) 20 MG tablet Take 20 mg by mouth daily.     dicyclomine (BENTYL) 20 MG tablet Take 1 tablet (20 mg total) by mouth 4 (four) times daily as needed for spasms. 120 tablet 3   Ferrous Gluconate-C-Folic Acid (IRON-C PO) Take by mouth.     levocetirizine (XYZAL) 5 MG tablet Take 5 mg by mouth every evening.     magnesium hydroxide (MILK OF MAGNESIA) 400 MG/5ML suspension Take 240 mLs by mouth daily as needed for mild constipation.     mesalamine (LIALDA) 1.2 g EC tablet Take 1 tablet (1.2 g total) by mouth 2 (two) times daily. 180 tablet 3   naproxen sodium (ALEVE) 220 MG tablet Take 220 mg by mouth.     OVER THE COUNTER MEDICATION Pt taking a time release B12 1000 mcg     OVER THE  COUNTER MEDICATION Magnesium 500 mg pt taking 1 a day     OVER THE COUNTER MEDICATION Pt taking fish oil 1200 mg pt taking 1 a day     pantoprazole (PROTONIX) 40 MG tablet Take 1 tablet (40 mg total) by mouth daily. 90 tablet 4   Current Facility-Administered Medications  Medication Dose Route Frequency Provider Last Rate Last Admin   0.9 %  sodium chloride infusion  500 mL Intravenous Once Tressia Danas, MD        Allergies  Allergen Reactions   Benadryl [Diphenhydramine]    Hydrocodone-Acetaminophen     Review of Systems:   Psychiatric/Behavioral: Has anxiety or depression     Physical Exam:    BP 130/70 (BP Location: Left Arm, Patient Position: Sitting, Cuff Size: Large)   Pulse 60   Ht 5' 0.5" (1.537 m)   Wt 236 lb 6 oz (107.2 kg)   BMI 45.40 kg/m  Wt Readings from Last 3 Encounters:  12/26/22 236 lb 6 oz (107.2 kg)  08/12/22 233 lb (105.7 kg)  11/29/21 236 lb (107 kg)   Constitutional:  Well-developed, in no acute distress. Psychiatric: Normal mood and affect. Behavior is normal. HEENT: Pupils normal.  Conjunctivae are normal. No scleral icterus. Cardiovascular: Normal rate, regular rhythm. No edema Pulmonary/chest: Effort normal and breath sounds normal. No wheezing, rales or rhonchi. Abdominal: Soft, nondistended. Nontender. Bowel sounds active throughout. There are no masses palpable. No hepatomegaly. Rectal: Deferred Neurological: Alert and oriented to person place and time. Skin: Skin is warm and dry. No rashes noted.  Data Reviewed: I have personally reviewed following labs and imaging studies  CBC:    Latest Ref Rng & Units 08/12/2022    9:48 AM 08/09/2021   10:32 AM 05/27/2019    1:22 PM  CBC  WBC 4.0 - 10.5 K/uL 6.9  7.9  8.6   Hemoglobin 12.0 - 15.0 g/dL 52.8  41.3  24.4   Hematocrit 36.0 - 46.0 % 41.8  41.3  41.3   Platelets 150.0 - 400.0 K/uL 315.0  286.0  310.0     CMP:    Latest Ref Rng & Units 08/12/2022    9:48 AM 08/09/2021   10:32 AM  05/27/2019    1:22 PM  CMP  Glucose 70 - 99 mg/dL 010  99  272   BUN 6 - 23 mg/dL 8  7  10    Creatinine 0.40 - 1.20 mg/dL 5.36  6.44  0.34   Sodium 135 - 145 mEq/L 138  137  141   Potassium 3.5 - 5.1 mEq/L 4.3  3.7  3.5   Chloride  96 - 112 mEq/L 102  102  101   CO2 19 - 32 mEq/L 28  29  32   Calcium 8.4 - 10.5 mg/dL 9.6  9.3  9.1   Total Protein 6.0 - 8.3 g/dL 7.6  7.0  7.2   Total Bilirubin 0.2 - 1.2 mg/dL 1.7  1.3  0.8   Alkaline Phos 39 - 117 U/L 83  87  71   AST 0 - 37 U/L 21  17  18    ALT 0 - 35 U/L 29  24  25          Edman Circle, MD 12/26/2022, 8:56 AM  Cc: No ref. provider found

## 2022-12-26 NOTE — Patient Instructions (Addendum)
_______________________________________________________  If your blood pressure at your visit was 140/90 or greater, please contact your primary care physician to follow up on this.  _______________________________________________________  If you are age 69 or older, your body mass index should be between 23-30. Your Body mass index is 45.4 kg/m. If this is out of the aforementioned range listed, please consider follow up with your Primary Care Provider.  If you are age 25 or younger, your body mass index should be between 19-25. Your Body mass index is 45.4 kg/m. If this is out of the aformentioned range listed, please consider follow up with your Primary Care Provider.   ________________________________________________________  The [facility] providers would like to encourage you to use [facility] to communicate with providers for non-urgent requests or questions.  Due to long hold times on the telephone, sending your provider a message by [facility] may be a faster and more efficient way to get a response.  Please allow 48 business hours for a response.  Please remember that this is for non-urgent requests.  _______________________________________________________  We have sent the following medications to your pharmacy for you to pick up at your convenience: Protonix  Please try to exercise daily  Please follow up in 12 months. Give [place] a call at [phone] to schedule an appointment.  Thank you,  Dr. Lynann Bologna

## 2023-08-30 ENCOUNTER — Other Ambulatory Visit: Payer: Self-pay | Admitting: Gastroenterology

## 2023-10-20 ENCOUNTER — Encounter: Payer: Self-pay | Admitting: Gastroenterology

## 2024-01-10 ENCOUNTER — Ambulatory Visit
Admission: EM | Admit: 2024-01-10 | Discharge: 2024-01-10 | Disposition: A | Discharge status: Home / Self Care | Attending: Family Medicine | Admitting: Family Medicine

## 2024-01-10 DIAGNOSIS — I10 Essential (primary) hypertension: Secondary | ICD-10-CM

## 2024-01-10 MED ORDER — AMLODIPINE BESYLATE 2.5 MG PO TABS: 5.0000 mg | ORAL_TABLET | Freq: Every day | ORAL | 1 refills | Status: AC

## 2024-01-10 NOTE — ED Triage Notes (Signed)
 Pt reports to UC for Amlodipine  refill. States she does not have  a Pcp at the moment. States she has been without BP meds for several weeks   Pt provide info on obtaining pcp

## 2024-01-10 NOTE — Discharge Instructions (Signed)
 I am refilling your blood pressure medication.  Take the medication as prescribed.  Based on your blood pressure readings I would start with 5 mg of amlodipine  daily.  This may be adjusted in the future.  Make sure you are trying to stay active, watching your diet and sodium intake.  These can all affect your blood pressure.  Elevate the feet when you have swelling. Follow-up with primary care as planned

## 2024-01-10 NOTE — ED Provider Notes (Signed)
 [facility]    CSN: [id number] Arrival date & time: 01/10/24  1404      History   Chief Complaint Chief Complaint  Patient presents with   Medication Refill    HPI Sara Mann is a 68 y.o. female.   Patient is a 68 year old female with a past medical history of hypertension, hyperlipidemia, and obesity.  She presents today due to elevated blood pressure readings.  She was previously on 2.5 mg of amlodipine  and has not had this in a few weeks due to in between changing doctors.  She had a slight headache and did not feel well and decided to take her blood pressure and found readings between 150-170 systolic and 90-100 diastolic.  Denies any associated chest pain, shortness of breath, dizziness or lightheadedness.   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   Past Surgical History:  Procedure Laterality Date   CHOLECYSTECTOMY      OB History   No obstetric history on file.      Home Medications    Prior to Admission medications   Medication Sig Start Date End Date Taking? Authorizing Provider  amLODipine  (NORVASC ) 2.5 MG tablet Take 2 tablets (5 mg total) by mouth at bedtime. 01/10/24 02/09/24  Adah Corning A, FNP  atorvastatin (LIPITOR) 10 MG tablet Take 10 mg by mouth daily.    [provider]  Biotin 1 MG CAPS Take by mouth.    [provider]  calcium carbonate (TUMS - DOSED IN MG ELEMENTAL CALCIUM) 500 MG chewable tablet Chew 1 tablet by mouth daily.    [provider]  Cholecalciferol (VITAMIN D3) 1.25 MG (50000 UT) CAPS Take by mouth.    [provider]  citalopram (CELEXA) 20 MG tablet Take 20 mg by mouth  daily.    [provider]  dicyclomine  (BENTYL ) 20 MG tablet Take 1 tablet (20 mg total) by mouth 4 (four) times daily as needed for spasms. 08/12/22   Charlanne Groom, MD  Ferrous Gluconate-C-Folic Acid (IRON-C PO) Take by mouth.    [provider]  levocetirizine (XYZAL) 5 MG tablet Take 5 mg by mouth every evening.    [provider]  magnesium hydroxide (MILK OF MAGNESIA) 400 MG/5ML suspension Take 240 mLs by mouth daily as needed for mild constipation.    [provider]  mesalamine  (LIALDA ) 1.2 g EC tablet Take 1 tablet by mouth twice daily 09/01/23   Charlanne Groom, MD  naproxen sodium (ALEVE) 220 MG tablet Take 220 mg by mouth.    [provider]  OVER THE COUNTER MEDICATION Pt taking a time release B12 1000 mcg    [provider]  OVER THE COUNTER MEDICATION Magnesium 500 mg pt taking 1 a day    [provider]  OVER THE COUNTER MEDICATION Pt taking fish oil 1200 mg pt taking 1 a day    [provider]  pantoprazole  (PROTONIX ) 40 MG tablet Take 1 tablet (40 mg total) by mouth daily. 08/12/22   Charlanne Groom, MD  pantoprazole  (PROTONIX ) 40 MG tablet Take 1 tablet (40 mg total)  by mouth daily. In the morning 12/26/22   Charlanne Groom, MD    Family History Family History  Problem Relation Age of Onset   Ovarian cancer Mother    Hypertension Daughter    Colon cancer Neg Hx    Rectal cancer Neg Hx    Stomach cancer Neg Hx    Esophageal cancer Neg Hx     Social History Social History   Tobacco Use   Smoking status: Never   Smokeless tobacco: Never  Vaping Use   Vaping status: Never Used  Substance Use Topics   Alcohol use: Yes    Comment: rarely   Drug use: Never     Allergies   Benadryl [diphenhydramine] and Hydrocodone-acetaminophen   Review of Systems Review of Systems See HPI  Physical Exam Triage Vital Signs ED Triage Vitals  Encounter Vitals Group     BP 01/10/24 1442 (!) 172/108     Girls  Systolic BP Percentile --      Girls Diastolic BP Percentile --      Boys Systolic BP Percentile --      Boys Diastolic BP Percentile --      Pulse Rate 01/10/24 1442 70     Resp 01/10/24 1442 20     Temp 01/10/24 1442 98.8 F (37.1 C)     Temp Source 01/10/24 1442 Oral     SpO2 01/10/24 1442 94 %     Weight --      Height --      Head Circumference --      Peak Flow --      Pain Score 01/10/24 1440 0     Pain Loc --      Pain Education --      Exclude from Growth Chart --    No data found.  Updated Vital Signs BP (!) 172/108 (BP Location: Right Arm)   Pulse 70   Temp 98.8 F (37.1 C) (Oral)   Resp 20   SpO2 94%   Visual Acuity Right Eye Distance:   Left Eye Distance:   Bilateral Distance:    Right Eye Near:   Left Eye Near:    Bilateral Near:     Physical Exam Vitals and nursing note reviewed.  Constitutional:      General: She is not in acute distress.    Appearance: Normal appearance. She is not ill-appearing, toxic-appearing or diaphoretic.  Pulmonary:     Effort: Pulmonary effort is normal.  Neurological:     Mental Status: She is alert.  Psychiatric:        Mood and Affect: Mood normal.      UC Treatments / Results  Labs (all labs ordered are listed, but only abnormal results are displayed) Labs Reviewed - No data to display  EKG   Radiology No results found.  Procedures Procedures (including critical care time)  Medications Ordered in UC Medications - No data to display  Initial Impression / Assessment and Plan / UC Course  I have reviewed the triage vital signs and the nursing notes.  Pertinent labs & imaging results that were available during my care of the patient were reviewed by me and considered in my medical decision making (see chart for details).     Essential hypertension-patient presents today for medication refill.  Currently taking 2.5 mg of amlodipine .  Has not had this in a few weeks.  Blood pressure elevated at  172/108 today.  She is not having any concerning signs  or symptoms today or red flags.  Will go ahead and refill her amlodipine  at this time.  Recommend go ahead and start on 5 mg daily instead of the 2.5 mg based on blood pressure reading.  Continue to monitor her blood pressure at home.  If she starts develop any of those concerning symptoms we discussed she will need to go to the ER otherwise follow-up with primary care for further management of this. Final Clinical Impressions(s) / UC Diagnoses   Final diagnoses:  Essential hypertension     Discharge Instructions      I am refilling your blood pressure medication.  Take the medication as prescribed.  Based on your blood pressure readings I would start with 5 mg of amlodipine  daily.  This may be adjusted in the future.  Make sure you are trying to stay active, watching your diet and sodium intake.  These can all affect your blood pressure.  Elevate the feet when you have swelling. Follow-up with primary care as planned   ED Prescriptions     Medication Sig Dispense Auth. Provider   amLODipine  (NORVASC ) 2.5 MG tablet Take 2 tablets (5 mg total) by mouth at bedtime. 60 tablet Adah Wilbert LABOR, FNP      PDMP not reviewed this encounter.   Adah Wilbert LABOR, FNP 01/10/24 [phone]

## 2024-04-05 ENCOUNTER — Ambulatory Visit: Admitting: Physician Assistant

## 2024-04-07 ENCOUNTER — Ambulatory Visit: Admitting: Gastroenterology
